# Patient Record
Sex: Male | Born: 1955 | Race: White | Hispanic: No | Marital: Married | Smoking: Never smoker
Health system: Southern US, Community
[De-identification: ages and names within clinical notes are randomized; demographics above are authoritative.]

## PROBLEM LIST (undated history)

## (undated) DIAGNOSIS — I6529 Occlusion and stenosis of unspecified carotid artery: Secondary | ICD-10-CM

## (undated) DIAGNOSIS — E11319 Type 2 diabetes mellitus with unspecified diabetic retinopathy without macular edema: Secondary | ICD-10-CM

## (undated) DIAGNOSIS — I519 Heart disease, unspecified: Secondary | ICD-10-CM

## (undated) DIAGNOSIS — I6503 Occlusion and stenosis of bilateral vertebral arteries: Secondary | ICD-10-CM

## (undated) DIAGNOSIS — Z8489 Family history of other specified conditions: Secondary | ICD-10-CM

## (undated) DIAGNOSIS — I639 Cerebral infarction, unspecified: Secondary | ICD-10-CM

## (undated) DIAGNOSIS — E119 Type 2 diabetes mellitus without complications: Secondary | ICD-10-CM

## (undated) DIAGNOSIS — N189 Chronic kidney disease, unspecified: Secondary | ICD-10-CM

## (undated) DIAGNOSIS — Z87442 Personal history of urinary calculi: Secondary | ICD-10-CM

## (undated) DIAGNOSIS — E785 Hyperlipidemia, unspecified: Secondary | ICD-10-CM

## (undated) HISTORY — DX: Chronic kidney disease, unspecified: N18.9

## (undated) HISTORY — PX: EYE SURGERY: SHX253

## (undated) HISTORY — DX: Cerebral infarction, unspecified: I63.9

## (undated) HISTORY — PX: APPENDECTOMY: SHX54

## (undated) HISTORY — DX: Heart disease, unspecified: I51.9

## (undated) HISTORY — DX: Occlusion and stenosis of unspecified carotid artery: I65.29

## (undated) HISTORY — DX: Hyperlipidemia, unspecified: E78.5

## (undated) HISTORY — DX: Type 2 diabetes mellitus without complications: E11.9

## (undated) HISTORY — DX: Occlusion and stenosis of bilateral vertebral arteries: I65.03

## (undated) HISTORY — PX: COLONOSCOPY: SHX174

## (undated) HISTORY — DX: Type 2 diabetes mellitus with unspecified diabetic retinopathy without macular edema: E11.319

---

## 2006-06-23 ENCOUNTER — Inpatient Hospital Stay (HOSPITAL_COMMUNITY): Admission: EM | Admit: 2006-06-23 | Discharge: 2006-06-30 | Payer: Self-pay | Admitting: Emergency Medicine

## 2006-06-23 ENCOUNTER — Ambulatory Visit: Payer: Self-pay | Admitting: Cardiology

## 2006-09-08 ENCOUNTER — Ambulatory Visit: Payer: Self-pay | Admitting: Internal Medicine

## 2006-09-08 ENCOUNTER — Ambulatory Visit (HOSPITAL_COMMUNITY): Admission: RE | Admit: 2006-09-08 | Discharge: 2006-09-08 | Payer: Self-pay | Admitting: Neurology

## 2016-03-28 DIAGNOSIS — N19 Unspecified kidney failure: Secondary | ICD-10-CM | POA: Diagnosis not present

## 2016-03-28 DIAGNOSIS — I639 Cerebral infarction, unspecified: Secondary | ICD-10-CM

## 2016-03-28 DIAGNOSIS — Z8673 Personal history of transient ischemic attack (TIA), and cerebral infarction without residual deficits: Secondary | ICD-10-CM

## 2016-03-28 DIAGNOSIS — I674 Hypertensive encephalopathy: Secondary | ICD-10-CM | POA: Diagnosis not present

## 2016-03-28 DIAGNOSIS — E119 Type 2 diabetes mellitus without complications: Secondary | ICD-10-CM

## 2016-03-29 DIAGNOSIS — I674 Hypertensive encephalopathy: Secondary | ICD-10-CM | POA: Diagnosis not present

## 2016-03-29 DIAGNOSIS — E119 Type 2 diabetes mellitus without complications: Secondary | ICD-10-CM | POA: Diagnosis not present

## 2016-03-29 DIAGNOSIS — Z8673 Personal history of transient ischemic attack (TIA), and cerebral infarction without residual deficits: Secondary | ICD-10-CM | POA: Diagnosis not present

## 2016-03-29 DIAGNOSIS — N19 Unspecified kidney failure: Secondary | ICD-10-CM | POA: Diagnosis not present

## 2016-04-15 DIAGNOSIS — I7090 Unspecified atherosclerosis: Secondary | ICD-10-CM | POA: Diagnosis not present

## 2016-04-15 DIAGNOSIS — Z8673 Personal history of transient ischemic attack (TIA), and cerebral infarction without residual deficits: Secondary | ICD-10-CM | POA: Diagnosis not present

## 2016-04-15 DIAGNOSIS — E538 Deficiency of other specified B group vitamins: Secondary | ICD-10-CM | POA: Diagnosis not present

## 2016-04-15 DIAGNOSIS — D751 Secondary polycythemia: Secondary | ICD-10-CM | POA: Diagnosis not present

## 2016-04-28 ENCOUNTER — Encounter: Payer: Self-pay | Admitting: Vascular Surgery

## 2016-04-30 DIAGNOSIS — E538 Deficiency of other specified B group vitamins: Secondary | ICD-10-CM | POA: Diagnosis not present

## 2016-04-30 DIAGNOSIS — D751 Secondary polycythemia: Secondary | ICD-10-CM | POA: Diagnosis not present

## 2016-06-02 ENCOUNTER — Encounter: Payer: Self-pay | Admitting: Vascular Surgery

## 2016-06-05 ENCOUNTER — Encounter (HOSPITAL_COMMUNITY): Payer: Medicare Other

## 2016-06-05 ENCOUNTER — Encounter: Payer: Medicare Other | Admitting: Vascular Surgery

## 2016-08-24 ENCOUNTER — Encounter: Payer: Self-pay | Admitting: Vascular Surgery

## 2016-09-04 ENCOUNTER — Ambulatory Visit (INDEPENDENT_AMBULATORY_CARE_PROVIDER_SITE_OTHER): Payer: Commercial Managed Care - PPO | Admitting: Vascular Surgery

## 2016-09-04 ENCOUNTER — Encounter: Payer: Self-pay | Admitting: Vascular Surgery

## 2016-09-04 VITALS — BP 107/80 | HR 50 | Temp 97.3°F | Resp 16 | Ht <= 58 in | Wt 193.0 lb

## 2016-09-04 DIAGNOSIS — I63019 Cerebral infarction due to thrombosis of unspecified vertebral artery: Secondary | ICD-10-CM | POA: Insufficient documentation

## 2016-09-04 DIAGNOSIS — I779 Disorder of arteries and arterioles, unspecified: Secondary | ICD-10-CM | POA: Diagnosis not present

## 2016-09-04 DIAGNOSIS — I739 Peripheral vascular disease, unspecified: Secondary | ICD-10-CM

## 2016-09-04 NOTE — Progress Notes (Signed)
Referred by:  Everlean Cherry, MD [address] [place], [place] [address]   Reason for referral: possible vertebral disease    History of Present Illness   Michael Hoffman is a 61 y.o. (1955-08-30) male who presents with cc: stroke.  This patient has limited recollection of his stroke in November.  Per his wife, he started having confusion similar to prior stroke and went to ED at [facility].  Her was diagnosed with a cerebral infarct at that location.  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  The patient is not certain on the sx of his recent CVA or his prior CVA.  His risk factors for carotid disease are: DM, HLD, and HTN.  Past Medical History 1. B carotid stenosis 2. CKD Stage 2 3. IDDM 4. Diabetic retinopathy 5. HLD 6. 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 CVA x 2  Past Surgical History: none reported   Social History   Social History  . Marital status: Married    Spouse name: N/A  . Number of children: N/A  . Years of education: N/A   Occupational History  . Not on file.   Social History Main Topics  . Smoking status: Not on file  . Smokeless tobacco: Not on file  . Alcohol use Not on file  . Drug use: Unknown  . Sexual activity: Not on file   Other Topics Concern  . Not on file   Social History Narrative  . No narrative on file    Family History: patient is unable to detail the medical history of his parents   Current Outpatient Prescriptions  Medication Sig Dispense Refill  . amLODipine (NORVASC) 10 MG tablet Take 10 mg by mouth daily.    . carvedilol (COREG) 25 MG tablet Take 25 mg by mouth 2 (two) times daily with a meal.    . citalopram (CELEXA) 10 MG tablet Take 10 mg by mouth daily.    . cloNIDine (CATAPRES) 0.1 MG tablet Take 0.1 mg by mouth 2 (two) times daily.    . clopidogrel (PLAVIX) 75 MG tablet  Take 75 mg by mouth daily.    Marland Kitchen donepezil (ARICEPT) 10 MG tablet Take 10 mg by mouth at bedtime.    . insulin glargine (LANTUS) 100 UNIT/ML injection Inject 10 Units into the skin at bedtime.    Marland Kitchen lisinopril (PRINIVIL,ZESTRIL) 40 MG tablet Take 40 mg by mouth daily.    . metFORMIN (GLUCOPHAGE) 500 MG tablet Take by mouth 2 (two) times daily with a meal.    . simvastatin (ZOCOR) 20 MG tablet Take 20 mg by mouth daily.     No current facility-administered medications for this visit.     No Known Allergies   REVIEW OF SYSTEMS:   Cardiac:  positive for: no symptoms, negative for: Chest pain or chest pressure, Shortness of breath upon exertion and Shortness of breath when lying flat,   Vascular:  positive for: Leg swelling,  negative for: Pain in calf, thigh, or hip brought on by ambulation, Pain in feet at night that wakes you up from your sleep and Blood clot in your veins  Pulmonary:  positive for: no symptoms,  negative for: Oxygen at home, Productive cough and Wheezing  Neurologic:  positive for: No symptoms, negative for: Sudden weakness in arms or legs, Sudden numbness in arms  or legs, Sudden onset of difficulty speaking or slurred speech, Temporary loss of vision in one eye and Problems with dizziness  Gastrointestinal:  positive for: no symptoms, negative for: Blood in stool and Vomited blood  Genitourinary:  positive for: no symptoms, negative for: Burning when urinating and Blood in urine  Psychiatric:  positive for: no symptoms,  negative for: Major depression  Hematologic:  positive for: no symptoms,  negative for: negative for: Bleeding problems and Problems with blood clotting too easily  Dermatologic:  positive for: abnormal skin lesions, negative for: Rashes or ulcers and abnormal skin lesions  Constitutional:  positive for: no symptoms, negative for: Fever or chills  Ear/Nose/Throat:  positive for: no symptoms, negative for: Change in hearing,  Nose bleeds and Sore throat  Musculoskeletal:  positive for: no symptoms, negative for: Back pain, Joint pain and Muscle pain   For VQI Use Only   PRE-ADM LIVING Home  AMB STATUS Ambulatory  CAD Sx None  PRIOR CHF None  STRESS TEST No    Physical Examination   Vitals:   09/04/16 1449 09/04/16 1450  BP: (!) 151/69 107/80  Pulse: (!) 50   Resp: 16   Temp: 97.3 F (36.3 C)   TempSrc: Oral   SpO2: 97%   Weight: 193 lb (87.5 kg)   Height: 1' (0.305 m)     General Alert, O x 3, WD, NAD  Head [place]/AT,    Ear/Nose/Throat Hearing grossly intact, nares without erythema or drainage, oropharynx without Erythema or Exudate, Mallampati score: 3, Dentition intact  Eyes PERRLA, EOMI,    Neck Supple, mid-line trachea,    Pulmonary Sym exp, good B air movt, CTA B  Cardiac RRR, Nl S1, S2, Murmur present: throughout all listening areas, No rubs, No S3,S4  Vascular Vessel Right Left  Radial Palpable Palpable  Brachial Palpable Palpable  Carotid Palpable, No Bruit Palpable, No Bruit  Aorta Not palpable N/A  Femoral Palpable Palpable  Popliteal Not palpable Not palpable  PT Palpable Palpable  DP Palpable Palpable    Gastrointestinal soft, non-distended, non-tender to palpation, No guarding or rebound, no HSM, no masses, no CVAT B, No palpable prominent aortic pulse,    Musculoskeletal M/S 5/5 throughout  , Extremities without ischemic changes  , No edema present, No obvious varicosities , No Lipodermatosclerosis present  Neurologic Cranial nerves 2-12 intact , Pain and light touch intact in extremities , Motor exam as listed above, no obvious discoordination  Psychiatric Judgement intact, Mood & affect appropriate for pt's clinical situation  Dermatologic See M/S exam for extremity exam, No rashes otherwise noted  Lymphatic  Palpable lymph nodes: None    Radiology   MRI Head (03/28/16):  5 mm acute infarct left inferior cerebellum  Atrophy and extensive chronic microvascular  ischemic changes have progressed since 2011  CTA Head and neck (03/29/16):  Atherosclerotic disease in the carotid arteries in the neck bilaterally without significant stenosis.  Occlusion of the proximal right vertebral artery which reconstitutes at C5 of a small vessel which is patent to the basilar.  Severe stenosis origin of the left vertebral artery which is subsequently widely patent to the basilar.  Dominant left vertebral artery.  Suboptimal enhancement of the intracranial arteries due to timing of the scan with extensive venous contrast. There is extensive atherosclerotic disease in the cavernous carotid bilaterally.  No intracranial large vessel occlusion.     Outside Studies/Documentation   10 pages of outside documents were reviewed including: outside radiologic study and  ED reports.   Medical Decision Making   Michael Hoffman is a 61 y.o. male who presents with: cerebellar CVA, R VA occlusion, possible L VA stenosis, IDDM with complications   Based on the patient's vascular studies and examination, I have offered the patient: referral to [facility] (Dr. Corliss Skains) for B carotid, cerebral, and vertebral angiography with possible L vertebral intervention.  Dr. Corliss Skains has previously completing imaging studies for this patient.  No one at this vascular surgical practice has done a vertebral transposition in the last decade, so if surgical intervention is needed, will refer to [facility]  It also make sense to refer this pt to [facility] as Vascular does not routine do endovascular intervention on the vertebral artery also.  I discussed in depth with the patient the nature of atherosclerosis, and emphasized the importance of maximal medical management including strict control of blood pressure, blood glucose, and lipid levels, obtaining regular exercise, antiplatelet agents, and cessation of smoking.   The patient is currently on a statin: Zocor.  The patient is currently on an  anti-platelet: Plavix.  The patient is aware that without maximal medical management the underlying atherosclerotic disease process will progress, limiting the benefit of any interventions.  Thank you for allowing [place] to participate in this patient's care.   Leonides Sake, MD, FACS [facility] Office: [phone] Pager: [phone]  09/04/2016, 3:22 PM

## 2016-09-07 ENCOUNTER — Encounter: Payer: Self-pay | Admitting: Internal Medicine

## 2016-09-21 ENCOUNTER — Other Ambulatory Visit (HOSPITAL_COMMUNITY): Payer: Self-pay | Admitting: Interventional Radiology

## 2016-09-21 DIAGNOSIS — I639 Cerebral infarction, unspecified: Secondary | ICD-10-CM

## 2016-09-21 DIAGNOSIS — I771 Stricture of artery: Secondary | ICD-10-CM

## 2016-10-01 ENCOUNTER — Other Ambulatory Visit: Payer: Self-pay | Admitting: Radiology

## 2016-10-01 ENCOUNTER — Other Ambulatory Visit: Payer: Self-pay | Admitting: Physician Assistant

## 2016-10-02 ENCOUNTER — Ambulatory Visit (HOSPITAL_COMMUNITY)
Admission: RE | Admit: 2016-10-02 | Discharge: 2016-10-02 | Disposition: A | Discharge status: Home / Self Care | Payer: Commercial Managed Care - PPO | Source: Ambulatory Visit | Attending: Interventional Radiology | Admitting: Interventional Radiology

## 2016-10-02 ENCOUNTER — Encounter (HOSPITAL_COMMUNITY): Payer: Self-pay

## 2016-10-02 ENCOUNTER — Other Ambulatory Visit (HOSPITAL_COMMUNITY): Payer: Self-pay | Admitting: Interventional Radiology

## 2016-10-02 DIAGNOSIS — I771 Stricture of artery: Secondary | ICD-10-CM

## 2016-10-02 DIAGNOSIS — G450 Vertebro-basilar artery syndrome: Secondary | ICD-10-CM | POA: Diagnosis not present

## 2016-10-02 DIAGNOSIS — E11319 Type 2 diabetes mellitus with unspecified diabetic retinopathy without macular edema: Secondary | ICD-10-CM | POA: Insufficient documentation

## 2016-10-02 DIAGNOSIS — E1122 Type 2 diabetes mellitus with diabetic chronic kidney disease: Secondary | ICD-10-CM | POA: Insufficient documentation

## 2016-10-02 DIAGNOSIS — I708 Atherosclerosis of other arteries: Secondary | ICD-10-CM | POA: Insufficient documentation

## 2016-10-02 DIAGNOSIS — Z7902 Long term (current) use of antithrombotics/antiplatelets: Secondary | ICD-10-CM | POA: Diagnosis not present

## 2016-10-02 DIAGNOSIS — E785 Hyperlipidemia, unspecified: Secondary | ICD-10-CM | POA: Insufficient documentation

## 2016-10-02 DIAGNOSIS — N189 Chronic kidney disease, unspecified: Secondary | ICD-10-CM | POA: Insufficient documentation

## 2016-10-02 DIAGNOSIS — Z8673 Personal history of transient ischemic attack (TIA), and cerebral infarction without residual deficits: Secondary | ICD-10-CM | POA: Insufficient documentation

## 2016-10-02 DIAGNOSIS — I639 Cerebral infarction, unspecified: Secondary | ICD-10-CM

## 2016-10-02 DIAGNOSIS — I6521 Occlusion and stenosis of right carotid artery: Secondary | ICD-10-CM | POA: Diagnosis present

## 2016-10-02 DIAGNOSIS — Z794 Long term (current) use of insulin: Secondary | ICD-10-CM | POA: Insufficient documentation

## 2016-10-02 HISTORY — PX: IR ANGIO INTRA EXTRACRAN SEL COM CAROTID INNOMINATE BILAT MOD SED: IMG5360

## 2016-10-02 HISTORY — PX: IR ANGIO VERTEBRAL SEL SUBCLAVIAN INNOMINATE BILAT MOD SED: IMG5366

## 2016-10-02 LAB — BASIC METABOLIC PANEL
Anion gap: 8 (ref 5–15)
BUN: 21 mg/dL — AB (ref 6–20)
CHLORIDE: 105 mmol/L (ref 101–111)
CO2: 28 mmol/L (ref 22–32)
CREATININE: 1.48 mg/dL — AB (ref 0.61–1.24)
Calcium: 9.4 mg/dL (ref 8.9–10.3)
GFR calc Af Amer: 58 mL/min — ABNORMAL LOW (ref >60)
GFR calc non Af Amer: 50 mL/min — ABNORMAL LOW (ref >60)
Glucose, Bld: 100 mg/dL — ABNORMAL HIGH (ref 65–99)
Potassium: 3.5 mmol/L (ref 3.5–5.1)
Sodium: 141 mmol/L (ref 135–145)

## 2016-10-02 LAB — CBC
HEMATOCRIT: 45.9 % (ref 39.0–52.0)
Hemoglobin: 16 g/dL (ref 13.0–17.0)
MCH: 31.1 pg (ref 26.0–34.0)
MCHC: 34.9 g/dL (ref 30.0–36.0)
MCV: 89.1 fL (ref 78.0–100.0)
PLATELETS: 261 10*3/uL (ref 150–400)
RBC: 5.15 MIL/uL (ref 4.22–5.81)
RDW: 12.5 % (ref 11.5–15.5)
WBC: 10.2 10*3/uL (ref 4.0–10.5)

## 2016-10-02 LAB — PROTIME-INR
INR: 0.93
Prothrombin Time: 12.5 seconds (ref 11.4–15.2)

## 2016-10-02 LAB — APTT: aPTT: 31 seconds (ref 24–36)

## 2016-10-02 MED ORDER — SODIUM CHLORIDE 0.9 % IV SOLN
INTRAVENOUS | Status: DC
  Administered 2016-10-02: 07:00:00 via INTRAVENOUS

## 2016-10-02 MED ORDER — HEPARIN SODIUM (PORCINE) 1000 UNIT/ML IJ SOLN
INTRAMUSCULAR | Status: AC | PRN
  Administered 2016-10-02: 1000 [IU] via INTRAVENOUS

## 2016-10-02 MED ORDER — IOPAMIDOL (ISOVUE-300) INJECTION 61%
INTRAVENOUS | Status: AC
  Administered 2016-10-02: 75 mL
  Filled 2016-10-02: qty 150

## 2016-10-02 MED ORDER — LIDOCAINE HCL 1 % IJ SOLN
INTRAMUSCULAR | Status: AC | PRN
  Administered 2016-10-02: 10 mL

## 2016-10-02 MED ORDER — SODIUM CHLORIDE 0.9 % IV SOLN
INTRAVENOUS | Status: AC | PRN
  Administered 2016-10-02: 250 mL via INTRAVENOUS

## 2016-10-02 MED ORDER — MIDAZOLAM HCL 2 MG/2ML IJ SOLN
INTRAMUSCULAR | Status: AC
  Filled 2016-10-02: qty 2

## 2016-10-02 MED ORDER — HEPARIN SODIUM (PORCINE) 1000 UNIT/ML IJ SOLN
INTRAMUSCULAR | Status: AC
  Filled 2016-10-02: qty 2

## 2016-10-02 MED ORDER — SODIUM CHLORIDE 0.9 % IV SOLN
INTRAVENOUS | Status: AC
  Administered 2016-10-02: 10:00:00 via INTRAVENOUS

## 2016-10-02 MED ORDER — LIDOCAINE HCL 1 % IJ SOLN
INTRAMUSCULAR | Status: AC
  Filled 2016-10-02: qty 20

## 2016-10-02 MED ORDER — FENTANYL CITRATE (PF) 100 MCG/2ML IJ SOLN
INTRAMUSCULAR | Status: AC
  Filled 2016-10-02: qty 2

## 2016-10-02 MED ORDER — IOPAMIDOL (ISOVUE-300) INJECTION 61%
INTRAVENOUS | Status: AC
  Administered 2016-10-02: 25 mL
  Filled 2016-10-02: qty 50

## 2016-10-02 NOTE — H&P (Signed)
Chief Complaint: Patient was seen in consultation today for cerebral arteriogram at the request of Dr Early Osmond  Referring Physician(s): Dr Early Osmond  Supervising Physician: Julieanne Cotton  Patient Status: [facility] - Out-pt  History of Present Illness: Michael Hoffman is a 61 y.o. male   Hx CVA 03/2016 Confusion and balance abnormality Symptoms remain of slow speech and minimal confusion Work up revealed CVA  MRI and CTA 03/2016:  MRI Head (03/28/16):  5 mm acute infarct left inferior cerebellum  Atrophy and extensive chronic microvascular ischemic changes have progressed since 2011  CTA Head and neck (03/29/16):  Atherosclerotic disease in the carotid arteries in the neck bilaterally without significant stenosis.  Occlusion of the proximal right vertebral artery which reconstitutes at C5 of a small vessel which is patent to the basilar.  Severe stenosis origin of the left vertebral artery which is subsequently widely patent to the basilar.  Dominant left vertebral artery. Suboptimal enhancement of the intracranial arteries due to timing of the scan with extensive venous contrast. There is extensive atherosclerotic disease in the cavernous carotid bilaterally.  No intracranial large vessel occlusion.    Request for cerebral arteriogram per Dr Imogene Burn Now scheduled for same  Past Medical History:  Diagnosis Date  . Carotid artery occlusion   . Chronic kidney disease   . Diabetes mellitus without complication (HCC)   . Diabetic retinopathy (HCC)   . Dyslipidemia   . Heart disease   . Stenosis of both vertebral arteries   . Stroke [facility])     History reviewed. No pertinent surgical history.  Allergies: Patient has no known allergies.  Medications: Prior to Admission medications   Medication Sig Start Date End Date Taking? Authorizing Provider  amLODipine (NORVASC) 10 MG tablet Take 10 mg by mouth daily.   Yes [provider]  carvedilol (COREG) 25 MG tablet  Take 25 mg by mouth 2 (two) times daily with a meal.   Yes [provider]  citalopram (CELEXA) 10 MG tablet Take 10 mg by mouth daily.   Yes [provider]  cloNIDine (CATAPRES) 0.1 MG tablet Take 0.1 mg by mouth 2 (two) times daily.   Yes [provider]  clopidogrel (PLAVIX) 75 MG tablet Take 75 mg by mouth daily.   Yes [provider]  donepezil (ARICEPT) 10 MG tablet Take 10 mg by mouth at bedtime.   Yes [provider]  insulin glargine (LANTUS) 100 UNIT/ML injection Inject 10 Units into the skin at bedtime.   Yes [provider]  lisinopril (PRINIVIL,ZESTRIL) 40 MG tablet Take 40 mg by mouth daily.   Yes [provider]  metFORMIN (GLUCOPHAGE) 1000 MG tablet Take 500 mg by mouth 2 (two) times daily with a meal.    Yes [provider]  simvastatin (ZOCOR) 20 MG tablet Take 20 mg by mouth daily.   Yes [provider]     History reviewed. No pertinent family history.  Social History   Social History  . Marital status: Married    Spouse name: N/A  . Number of children: N/A  . Years of education: N/A   Social History Main Topics  . Smoking status: None  . Smokeless tobacco: None  . Alcohol use None  . Drug use: Unknown  . Sexual activity: Not Asked   Other Topics Concern  . None   Social History Narrative  . None    Review of Systems: A 12 point ROS discussed and pertinent  positives are indicated in the HPI above.  All other systems are negative.  Review of Systems  Constitutional: Negative for appetite change, fatigue and fever.  HENT: Negative for tinnitus and trouble swallowing.   Eyes: Negative for visual disturbance.  Respiratory: Negative for cough and shortness of breath.   Cardiovascular: Negative for chest pain.  Gastrointestinal: Negative for abdominal pain.  Musculoskeletal: Negative for gait problem.  Neurological: Positive for speech difficulty. Negative for dizziness,  tremors, seizures, syncope, facial asymmetry, weakness, light-headedness, numbness and headaches.  Psychiatric/Behavioral: Positive for decreased concentration. Negative for behavioral problems.    Vital Signs: BP 123/60   Pulse (!) 43   Temp 97.6 F (36.4 C) (Oral)   Resp 16   Ht 5\' 7"  (1.702 m)   Wt 190 lb (86.2 kg)   SpO2 98%   BMI 29.76 kg/m   Physical Exam  Constitutional: He is oriented to person, place, and time. He appears well-nourished.  HENT:  Head: Atraumatic.  Eyes: EOM are normal.  Neck: Neck supple.  Cardiovascular: Normal rate and regular rhythm.   Murmur heard. Pulmonary/Chest: Effort normal and breath sounds normal. He has no wheezes.  Abdominal: Soft. Bowel sounds are normal. There is no tenderness.  Musculoskeletal: Normal range of motion.  Neurological: He is alert and oriented to person, place, and time.  Skin: Skin is warm and dry.  Psychiatric: He has a normal mood and affect. His behavior is normal. Judgment and thought content normal.  Speech is slow- but accurate  Nursing note and vitals reviewed.   Mallampati Score:  MD Evaluation Airway: WNL Heart: WNL Abdomen: WNL Chest/ Lungs: WNL ASA  Classification: 2 Mallampati/Airway Score: Two  Imaging: No results found.  Labs:  CBC:  Recent Labs  10/02/16 0707  WBC 10.2  HGB 16.0  HCT 45.9  PLT 261    COAGS:  Recent Labs  10/02/16 0707  INR 0.93  APTT 31    BMP: No results for input(s): NA, K, CL, CO2, GLUCOSE, BUN, CALCIUM, CREATININE, GFRNONAA, GFRAA in the last 8760 hours.  Invalid input(s): CMP  LIVER FUNCTION TESTS: No results for input(s): BILITOT, AST, ALT, ALKPHOS, PROT, ALBUMIN in the last 8760 hours.  TUMOR MARKERS: No results for input(s): AFPTM, CEA, CA199, CHROMGRNA in the last 8760 hours.  Assessment and Plan:  CVA 03/2016 Evaluation was scheduled with Dr Imogene Burnhen in 05/2016 but rescheduled to 08/2016 Bilat vertebral artery stenosis Now scheduled for  cerebral arteriogram  Risks and Benefits discussed with the patient including, but not limited to bleeding, infection, vascular injury, contrast induced renal failure, stroke or even death. All of the patient's questions were answered, patient is agreeable to proceed. Consent signed and in chart.  Thank you for this interesting consult.  I greatly enjoyed meeting Rual R Pflug and look forward to participating in their care.  A copy of this report was sent to the requesting provider on this date.  Electronically Signed: Robet LeuURPIN,Jawana Reagor A, PA-C 10/02/2016, 7:35 AM   I spent a total of  30 Minutes   in face to face in clinical consultation, greater than 50% of which was counseling/coordinating care for cerebral arteriogram

## 2016-10-02 NOTE — Sedation Documentation (Signed)
5 Fr. Exoseal to right groin 

## 2016-10-02 NOTE — Progress Notes (Signed)
Lying blood pressure 147/58 Standing blood pressure 147/59 Right groin unremarkable, clean dry and intact dressing Pt up to bathroom without lightheadedness. Groin remains unremarkable upon return to room.

## 2016-10-02 NOTE — Discharge Instructions (Addendum)
Angiogram, Care After °This sheet gives you information about how to care for yourself after your procedure. Your health care provider may also give you more specific instructions. If you have problems or questions, contact your health care provider. °What can I expect after the procedure? °After the procedure, it is common to have bruising and tenderness at the catheter insertion area. °Follow these instructions at home: °Insertion site care  °· Follow instructions from your health care provider about how to take care of your insertion site. Make sure you: °¨ Wash your hands with soap and water before you change your bandage (dressing). If soap and water are not available, use hand sanitizer. °¨ Change your dressing as told by your health care provider. °¨ Leave stitches (sutures), skin glue, or adhesive strips in place. These skin closures may need to stay in place for 2 weeks or longer. If adhesive strip edges start to loosen and curl up, you may trim the loose edges. Do not remove adhesive strips completely unless your health care provider tells you to do that. °· Do not take baths, swim, or use a hot tub until your health care provider approves. °· You may shower 24-48 hours after the procedure or as told by your health care provider. °¨ Gently wash the site with plain soap and water. °¨ Pat the area dry with a clean towel. °¨ Do not rub the site. This may cause bleeding. °· Do not apply powder or lotion to the site. Keep the site clean and dry. °· Check your insertion site every day for signs of infection. Check for: °¨ Redness, swelling, or pain. °¨ Fluid or blood. °¨ Warmth. °¨ Pus or a bad smell. °Activity  °· Rest as told by your health care provider, usually for 1-2 days. °· Do not lift anything that is heavier than 10 lbs. (4.5 kg) or as told by your health care provider. °· Do not drive for 24 hours if you were given a medicine to help you relax (sedative). °· Do not drive or use heavy machinery while  taking prescription pain medicine. °General instructions  °· Return to your normal activities as told by your health care provider, usually in about a week. Ask your health care provider what activities are safe for you. °· If the catheter site starts bleeding, lie flat and put pressure on the site. If the bleeding does not stop, get help right away. This is a medical emergency. °· Drink enough fluid to keep your urine clear or pale yellow. This helps flush the contrast dye from your body. °· Take over-the-counter and prescription medicines only as told by your health care provider. °· Keep all follow-up visits as told by your health care provider. This is important. °Contact a health care provider if: °· You have a fever or chills. °· You have redness, swelling, or pain around your insertion site. °· You have fluid or blood coming from your insertion site. °· The insertion site feels warm to the touch. °· You have pus or a bad smell coming from your insertion site. °· You have bruising around the insertion site. °· You notice blood collecting in the tissue around the catheter site (hematoma). The hematoma may be painful to the touch. °Get help right away if: °· You have severe pain at the catheter insertion area. °· The catheter insertion area swells very fast. °· The catheter insertion area is bleeding, and the bleeding does not stop when you hold steady pressure on   the area.  The area near or just beyond the catheter insertion site becomes pale, cool, tingly, or numb. These symptoms may represent a serious problem that is an emergency. Do not wait to see if the symptoms will go away. Get medical help right away. Call your local emergency services (911 in the [place] Do not drive yourself to the hospital. Summary  After the procedure, it is common to have bruising and tenderness at the catheter insertion area.  After the procedure, it is important to rest and drink plenty of fluids.  Do not take baths,  swim, or use a hot tub until your health care provider says it is okay to do so. You may shower 24-48 hours after the procedure or as told by your health care provider.  If the catheter site starts bleeding, lie flat and put pressure on the site. If the bleeding does not stop, get help right away. This is a medical emergency. This information is not intended to replace advice given to you by your health care provider. Make sure you discuss any questions you have with your health care provider. Document Released: 11/20/2004 Document Revised: 04/08/2016 Document Reviewed: 04/08/2016 Elsevier Interactive Patient Education  2017 [employer].    Outpatient Metformin Instructions (Glucophage, Glucovance, Fortamet, Riomet, [place]green, [place], Actoplus met  Avandamet, Janumet)   Patient: Michael Hoffman                                                10/02/2016:    Radiology Exam:     As part of your exam today in the Radiology Department, you were given a radiographic contrast material or x-ray dye.  Because you have had this contrast material and you are taking a Metformin drug (Glucophage, Glucovance, Avandamet, Fortamet, Riomet, Metaglip, Glumetza, Actoplus met, Actoplus Met XR, Prandimet or Janumet), please observe the following instructions:   DO NOT  Take this medication for 48 hours after your exam.  Because you have normal renal function and have no comorbidities, you may restart your medication in 48 hours with no need for a renal function test or consultation with your physician.  You have normal renal function but have some comorbidities.  Comorbidities include liver disease, alcohol overuse, heart failure, myocardial or muscular ischemia, sepsis, or other severe infection.  Therefore you should consult your physician before restarting your medication.  You have impaired renal function.  You should consult your physician before restarting your medication and you are advised to get a  renal function test before restarting your medication.  Please discuss this with your physician.   Call your doctor before you start taking this medication again.  Your doctor may want to check your kidney function before you start taking this medication again.  I understand these instructions and have had an opportunity to discuss them with Radiology Department personnel.      Cerebral Angiogram, Care After Refer to this sheet in the next few weeks. These instructions provide you with information on caring for yourself after your procedure. Your health care provider may also give you more specific instructions. Your treatment has been planned according to current medical practices, but problems sometimes occur. Call your health care provider if you have any problems or questions after your procedure. What can I expect after the procedure? After your procedure, it is typical to have  the following:  Bruising at the catheter insertion site that usually fades within 1-2 weeks.  Blood collecting in the tissue (hematoma) that may be painful to the touch. It should usually decrease in size and tenderness within 1-2 weeks.  A mild headache. Follow these instructions at home:  Take medicines only as directed by your health care provider.  You may shower 24-48 hours after the procedure or as directed by your health care provider. Remove the bandage (dressing) and gently wash the site with plain soap and water. Pat the area dry with a clean towel. Do not rub the site, because this may cause bleeding.  Do not take baths, swim, or use a hot tub until your health care provider approves.  Check your insertion site every day for redness, swelling, or drainage.  Do not apply powder or lotion to the site.  Do not lift over 10 lb (4.5 kg) for 5 days after your procedure or as directed by your health care provider.  Ask your health care provider when it is okay to:  Return to work or  school.  Resume usual physical activities or sports.  Resume sexual activity.  Do not drive home if you are discharged the same day as the procedure. Have someone else drive you.  You may drive 24 hours after the procedure unless otherwise instructed by your health care provider.  Do not operate machinery or power tools for 24 hours after the procedure or as directed by your health care provider.  If your procedure was done as an outpatient procedure, which means that you went home the same day as your procedure, a responsible adult should be with you for the first 24 hours after you arrive home.  Keep all follow-up visits as directed by your health care provider. This is important. Contact a health care provider if:  You have a fever.  You have chills.  You have increased bleeding from the catheter insertion site. Hold pressure on the site. Get help right away if:  You have vision changes or loss of vision.  You have numbness or weakness on one side of your body.  You have difficulty talking, or you have slurred speech or cannot speak (aphasia).  You feel confused or have difficulty remembering.  You have unusual pain at the catheter insertion site.  You have redness, warmth, or swelling at the catheter insertion site.  You have drainage (other than a small amount of blood on the dressing) from the catheter insertion site.  The catheter insertion site is bleeding, and the bleeding does not stop after 30 minutes of holding steady pressure on the site. These symptoms may represent a serious problem that is an emergency. Do not wait to see if the symptoms will go away. Get medical help right away. Call your local emergency services (911 in [place] Do not drive yourself to the hospital. This information is not intended to replace advice given to you by your health care provider. Make sure you discuss any questions you have with your health care provider. Document Released:  09/18/2013 Document Revised: 10/10/2015 Document Reviewed: 05/17/2013 Elsevier Interactive Patient Education  2017 [place]. Moderate Conscious Sedation, Adult, Care After These instructions provide you with information about caring for yourself after your procedure. Your health care provider may also give you more specific instructions. Your treatment has been planned according to current medical practices, but problems sometimes occur. Call your health care provider if you have any problems or questions after  your procedure. What can I expect after the procedure? After your procedure, it is common:  To feel sleepy for several hours.  To feel clumsy and have poor balance for several hours.  To have poor judgment for several hours.  To vomit if you eat too soon. Follow these instructions at home: For at least 24 hours after the procedure:    Do not:  Participate in activities where you could fall or become injured.  Drive.  Use heavy machinery.  Drink alcohol.  Take sleeping pills or medicines that cause drowsiness.  Make important decisions or sign legal documents.  Take care of children on your own.  Rest. Eating and drinking   Follow the diet recommended by your health care provider.  If you vomit:  Drink water, juice, or soup when you can drink without vomiting.  Make sure you have little or no nausea before eating solid foods. General instructions   Have a responsible adult stay with you until you are awake and alert.  Take over-the-counter and prescription medicines only as told by your health care provider.  If you smoke, do not smoke without supervision.  Keep all follow-up visits as told by your health care provider. This is important. Contact a health care provider if:  You keep feeling nauseous or you keep vomiting.  You feel light-headed.  You develop a rash.  You have a fever. Get help right away if:  You have trouble breathing. This  information is not intended to replace advice given to you by your health care provider. Make sure you discuss any questions you have with your health care provider. Document Released: 02/22/2013 Document Revised: 10/07/2015 Document Reviewed: 08/24/2015 Elsevier Interactive Patient Education  2017 [employer].

## 2016-10-02 NOTE — Procedures (Signed)
S/P 4 vessel cerebral arteriogram RT CFA approach. Findings. 1. 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

## 2016-10-06 ENCOUNTER — Encounter (HOSPITAL_COMMUNITY): Payer: Self-pay | Admitting: Interventional Radiology

## 2016-10-19 ENCOUNTER — Telehealth (HOSPITAL_COMMUNITY): Payer: Self-pay | Admitting: Radiology

## 2016-10-19 ENCOUNTER — Other Ambulatory Visit (HOSPITAL_COMMUNITY): Payer: Self-pay | Admitting: Interventional Radiology

## 2016-10-19 DIAGNOSIS — I771 Stricture of artery: Secondary | ICD-10-CM

## 2016-10-19 NOTE — Telephone Encounter (Signed)
Called pt, left VM for them to call to set up left vertebral artery stenting procedure. JM

## 2016-10-23 ENCOUNTER — Encounter (HOSPITAL_COMMUNITY): Payer: Self-pay | Admitting: *Deleted

## 2016-10-23 ENCOUNTER — Other Ambulatory Visit: Payer: Self-pay | Admitting: General Surgery

## 2016-10-23 ENCOUNTER — Other Ambulatory Visit: Payer: Self-pay | Admitting: Physician Assistant

## 2016-10-25 ENCOUNTER — Ambulatory Visit (HOSPITAL_COMMUNITY): Payer: Commercial Managed Care - PPO | Admitting: Anesthesiology

## 2016-10-25 NOTE — Anesthesia Preprocedure Evaluation (Deleted)
Anesthesia Evaluation  Patient identified by MRN, date of birth, ID band Patient awake    Reviewed: Allergy & Precautions, H&P , NPO status , Patient's Chart, lab work & pertinent test results, reviewed documented beta blocker date and time   Airway Mallampati: II  TM Distance: >3 FB Neck ROM: Full    Dental no notable dental hx. (+) Teeth Intact, Dental Advisory Given   Pulmonary neg pulmonary ROS,    Pulmonary exam normal breath sounds clear to auscultation       Cardiovascular Exercise Tolerance: Good hypertension, On Medications and On Home Beta Blockers + Peripheral Vascular Disease   Rhythm:Regular Rate:Normal + Systolic murmurs    Neuro/Psych CVA, No Residual Symptoms negative neurological ROS  negative psych ROS   GI/Hepatic negative GI ROS, Neg liver ROS,   Endo/Other  diabetes, Insulin Dependent, Oral Hypoglycemic Agents  Renal/GU Renal InsufficiencyRenal disease  negative genitourinary   Musculoskeletal   Abdominal   Peds  Hematology negative hematology ROS (+)   Anesthesia Other Findings   Reproductive/Obstetrics negative OB ROS                            Anesthesia Physical Anesthesia Plan  ASA: III  Anesthesia Plan: General   Post-op Pain Management:    Induction: Intravenous  PONV Risk Score and Plan: 3 and Ondansetron, Dexamethasone, Propofol and Midazolam  Airway Management Planned: Oral ETT  Additional Equipment: Arterial line  Intra-op Plan:   Post-operative Plan: Extubation in OR and Possible Post-op intubation/ventilation  Informed Consent: I have reviewed the patients History and Physical, chart, labs and discussed the procedure including the risks, benefits and alternatives for the proposed anesthesia with the patient or authorized representative who has indicated his/her understanding and acceptance.   Dental advisory given  Plan Discussed with:  CRNA  Anesthesia Plan Comments:        Anesthesia Quick Evaluation

## 2016-10-26 ENCOUNTER — Encounter (HOSPITAL_COMMUNITY): Payer: Self-pay | Admitting: *Deleted

## 2016-10-26 ENCOUNTER — Encounter (HOSPITAL_COMMUNITY): Payer: Self-pay

## 2016-10-26 ENCOUNTER — Ambulatory Visit (HOSPITAL_COMMUNITY)
Admission: RE | Admit: 2016-10-26 | Discharge: 2016-10-26 | Disposition: A | Discharge status: Home / Self Care | Payer: Commercial Managed Care - PPO | Source: Ambulatory Visit | Attending: Interventional Radiology | Admitting: Interventional Radiology

## 2016-10-26 ENCOUNTER — Encounter (HOSPITAL_COMMUNITY)
Admission: RE | Disposition: A | Discharge status: Home / Self Care | Payer: Self-pay | Source: Ambulatory Visit | Attending: Interventional Radiology

## 2016-10-26 ENCOUNTER — Ambulatory Visit (HOSPITAL_COMMUNITY): Admission: RE | Admit: 2016-10-26 | Payer: Commercial Managed Care - PPO | Source: Ambulatory Visit

## 2016-10-26 DIAGNOSIS — Z7982 Long term (current) use of aspirin: Secondary | ICD-10-CM | POA: Insufficient documentation

## 2016-10-26 DIAGNOSIS — E1122 Type 2 diabetes mellitus with diabetic chronic kidney disease: Secondary | ICD-10-CM | POA: Diagnosis not present

## 2016-10-26 DIAGNOSIS — N189 Chronic kidney disease, unspecified: Secondary | ICD-10-CM | POA: Insufficient documentation

## 2016-10-26 DIAGNOSIS — I6521 Occlusion and stenosis of right carotid artery: Secondary | ICD-10-CM | POA: Insufficient documentation

## 2016-10-26 DIAGNOSIS — E11319 Type 2 diabetes mellitus with unspecified diabetic retinopathy without macular edema: Secondary | ICD-10-CM | POA: Diagnosis not present

## 2016-10-26 DIAGNOSIS — Z8673 Personal history of transient ischemic attack (TIA), and cerebral infarction without residual deficits: Secondary | ICD-10-CM | POA: Insufficient documentation

## 2016-10-26 DIAGNOSIS — Z7902 Long term (current) use of antithrombotics/antiplatelets: Secondary | ICD-10-CM | POA: Diagnosis not present

## 2016-10-26 DIAGNOSIS — I6503 Occlusion and stenosis of bilateral vertebral arteries: Secondary | ICD-10-CM | POA: Diagnosis not present

## 2016-10-26 DIAGNOSIS — Z538 Procedure and treatment not carried out for other reasons: Secondary | ICD-10-CM | POA: Diagnosis not present

## 2016-10-26 DIAGNOSIS — I708 Atherosclerosis of other arteries: Secondary | ICD-10-CM | POA: Insufficient documentation

## 2016-10-26 DIAGNOSIS — Z794 Long term (current) use of insulin: Secondary | ICD-10-CM | POA: Insufficient documentation

## 2016-10-26 DIAGNOSIS — E785 Hyperlipidemia, unspecified: Secondary | ICD-10-CM | POA: Insufficient documentation

## 2016-10-26 HISTORY — DX: Personal history of urinary calculi: Z87.442

## 2016-10-26 HISTORY — DX: Family history of other specified conditions: Z84.89

## 2016-10-26 HISTORY — PX: RADIOLOGY WITH ANESTHESIA: SHX6223

## 2016-10-26 LAB — CBC
HEMATOCRIT: 47.5 % (ref 39.0–52.0)
Hemoglobin: 16.6 g/dL (ref 13.0–17.0)
MCH: 31.6 pg (ref 26.0–34.0)
MCHC: 34.9 g/dL (ref 30.0–36.0)
MCV: 90.3 fL (ref 78.0–100.0)
PLATELETS: 283 10*3/uL (ref 150–400)
RBC: 5.26 MIL/uL (ref 4.22–5.81)
RDW: 12.2 % (ref 11.5–15.5)
WBC: 10.2 10*3/uL (ref 4.0–10.5)

## 2016-10-26 LAB — PROTIME-INR
INR: 0.89
Prothrombin Time: 12 seconds (ref 11.4–15.2)

## 2016-10-26 LAB — BASIC METABOLIC PANEL
Anion gap: 8 (ref 5–15)
BUN: 17 mg/dL (ref 6–20)
CALCIUM: 9.7 mg/dL (ref 8.9–10.3)
CO2: 26 mmol/L (ref 22–32)
CREATININE: 1.28 mg/dL — AB (ref 0.61–1.24)
Chloride: 103 mmol/L (ref 101–111)
GFR calc Af Amer: >60 mL/min (ref >60)
GFR, EST NON AFRICAN AMERICAN: 60 mL/min — AB (ref >60)
GLUCOSE: 211 mg/dL — AB (ref 65–99)
POTASSIUM: 3.3 mmol/L — AB (ref 3.5–5.1)
SODIUM: 137 mmol/L (ref 135–145)

## 2016-10-26 LAB — GLUCOSE, CAPILLARY: Glucose-Capillary: 222 mg/dL — ABNORMAL HIGH (ref 65–99)

## 2016-10-26 LAB — PLATELET INHIBITION P2Y12: PLATELET FUNCTION P2Y12: 33 [PRU] — AB (ref 194–418)

## 2016-10-26 LAB — APTT: aPTT: 31 seconds (ref 24–36)

## 2016-10-26 SURGERY — RADIOLOGY WITH ANESTHESIA
Anesthesia: General | Laterality: Left

## 2016-10-26 MED ORDER — SODIUM CHLORIDE 0.9 % IV SOLN: INTRAVENOUS | Status: DC

## 2016-10-26 NOTE — Progress Notes (Signed)
Patient ID: Michael Hoffman, male   DOB: 05/31/1955, 61 y.o.   MRN: [id number]   Scheduled for Left vertebral artery angioplasty/stent placement  Unfortunately, Dr Corliss Skainseveshwar not well this am Will reschedule for later in week possibly  Pt aware and understandable; agreeable

## 2016-10-26 NOTE — H&P (Signed)
Chief Complaint: Patient was seen in consultation today for cerebral arteriogram with possible Left Vertebral artery angioplasty/stent at the request of Dr Early Osmond  Referring Physician(s): Dr Early Osmond  Supervising Physician: Julieanne Cotton  Patient Status: [facility] - Out-pt  History of Present Illness: Michael Hoffman is a 61 y.o. male   Hx CVA 03/2016 Confusion and balance abnormality Slow speech and minimal confusion Work up revealed CVA  Cerebral arteriogram 10/02/16:  IMPRESSION: 90% plus stenoses of the dominant left vertebral artery at its origin with mild poststenotic dilatation. Angiographic occlusion of the right vertebral artery with distal reconstitution from musculoskeletal branches of the ascending cervical branch of the right thyrocervical trunk. 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 Approximately 50% stenosis of the right internal carotid artery supraclinoid segment. Moderate intracranial arteriosclerotic changes involving the middle cerebral artery distributions, and the right vertebrobasilar and the PICA distributions, and also of the left posterior cerebral Arteries.  Pt states he is taking ASA and Plavix daily Symptoms of imbalance still an issue- falls occasionally at home Does not use walker or cane.  Now scheduled for angioplasty/stent of Left vertebral artery stenosis   Past Medical History:  Diagnosis Date  . Carotid artery occlusion   . Chronic kidney disease   . Diabetes mellitus without complication (HCC)    Type II  . Diabetic retinopathy (HCC)   . Dyslipidemia   . Family history of adverse reaction to anesthesia    mother had hard time waking up  . Heart disease   . History of kidney stones    passed some, lithrotrispy  . Stenosis of both vertebral arteries   . Stroke (HCC)    x 2 some memory loss    Past Surgical History:  Procedure Laterality Date  .  APPENDECTOMY    . COLONOSCOPY    . EYE SURGERY Bilateral    cataract surgery with lens implant  . IR ANGIO INTRA EXTRACRAN SEL COM CAROTID INNOMINATE BILAT MOD SED  10/02/2016  . IR ANGIO VERTEBRAL SEL SUBCLAVIAN INNOMINATE BILAT MOD SED  10/02/2016    Allergies: No known allergies  Medications: Prior to Admission medications   Medication Sig Start Date End Date Taking? Authorizing Provider  amLODipine (NORVASC) 10 MG tablet Take 10 mg by mouth daily.   Yes [provider]  aspirin EC 81 MG tablet Take 81 mg by mouth daily.   Yes [provider]  carvedilol (COREG) 25 MG tablet Take 25 mg by mouth 2 (two) times daily with a meal.   Yes [provider]  citalopram (CELEXA) 10 MG tablet Take 10 mg by mouth daily.   Yes [provider]  cloNIDine (CATAPRES) 0.1 MG tablet Take 0.1 mg by mouth 2 (two) times daily.   Yes [provider]  clopidogrel (PLAVIX) 75 MG tablet Take 75 mg by mouth daily.   Yes [provider]  donepezil (ARICEPT) 10 MG tablet Take 10 mg by mouth at bedtime.   Yes [provider]  insulin glargine (LANTUS) 100 UNIT/ML injection Inject 10 Units into the skin at bedtime.   Yes [provider]  lisinopril (PRINIVIL,ZESTRIL) 40 MG tablet Take 40 mg by mouth daily.   Yes [provider]  metFORMIN (GLUCOPHAGE) 1000 MG tablet Take 500 mg by mouth 2 (two) times daily with a meal.    Yes [provider]  simvastatin (ZOCOR) 20 MG tablet Take 20 mg  by mouth daily.   Yes [provider]     History reviewed. No pertinent family history.  Social History   Social History  . Marital status: Married    Spouse name: N/A  . Number of children: N/A  . Years of education: N/A   Social History Main Topics  . Smoking status: Never Smoker  . Smokeless tobacco: Current User    Types: Chew  . Alcohol use No  . Drug use: No  . Sexual activity: Not Asked   Other Topics Concern  .  None   Social History Narrative  . None    Review of Systems: A 12 point ROS discussed and pertinent positives are indicated in the HPI above.  All other systems are negative.  Review of Systems  Constitutional: Negative for activity change, appetite change, fatigue and fever.  HENT: Negative for tinnitus and trouble swallowing.   Eyes: Negative for visual disturbance.  Respiratory: Negative for cough and shortness of breath.   Cardiovascular: Negative for chest pain.  Gastrointestinal: Negative for abdominal pain, nausea and vomiting.  Musculoskeletal: Negative for back pain and gait problem.  Neurological: Positive for light-headedness. Negative for dizziness, tremors, seizures, syncope, facial asymmetry, speech difficulty, weakness, numbness and headaches.       Occasional light headed; imbalanced  Psychiatric/Behavioral: Negative for behavioral problems and confusion.    Vital Signs: BP 131/62   Pulse (!) 49   Temp 98 F (36.7 C) (Oral)   Resp 18   Ht 5\' 6"  (1.676 m)   Wt 190 lb (86.2 kg)   SpO2 97%   BMI 30.67 kg/m   Physical Exam  Constitutional: He is oriented to person, place, and time. He appears well-nourished.  HENT:  Head: Atraumatic.  Eyes: EOM are normal.  Neck: Neck supple.  Cardiovascular: Normal rate and regular rhythm.   Murmur heard. Pulmonary/Chest: Effort normal and breath sounds normal. He has no wheezes.  Abdominal: Soft. Bowel sounds are normal. There is no tenderness.  Musculoskeletal: Normal range of motion. He exhibits no edema, tenderness or deformity.  Neurological: He is alert and oriented to person, place, and time.  Skin: Skin is warm and dry.  Psychiatric: He has a normal mood and affect. His behavior is normal. Judgment and thought content normal.  Nursing note and vitals reviewed.   Mallampati Score:  MD Evaluation Airway: WNL Heart: WNL Abdomen: WNL Chest/ Lungs: WNL ASA  Classification: 3 Mallampati/Airway Score:  One  Imaging: Ir Angio Intra Extracran Sel Com Carotid Innominate Bilat Mod Sed  Result Date: 10/06/2016 INDICATION: Vertebrobasilar ischemic symptoms.  Gait imbalance.  Dizziness. EXAM: BILATERAL COMMON CAROTID AND INNOMINATE ANGIOGRAPHY AND BILATERAL VERTEBRAL ARTERY ANGIOGRAMS MEDICATIONS: Heparin 1000 units IV. No antibiotic was administered within 1 hour of the procedure. ANESTHESIA/SEDATION: Versed 1 mg IV; Fentanyl 25 mcg IV. Moderate Sedation Time:  30 minutes. The patient was continuously monitored during the procedure by the interventional radiology nurse under my direct supervision. FLUOROSCOPY TIME:  Fluoroscopy Time: 8 minutes 24 seconds (167 mGy). COMPLICATIONS: None immediate. TECHNIQUE: Informed written consent was obtained from the patient after a thorough discussion of the procedural risks, benefits and alternatives. All questions were addressed. Maximal Sterile Barrier Technique was utilized including caps, mask, sterile gowns, sterile gloves, sterile drape, hand hygiene and skin antiseptic. A timeout was performed prior to the initiation of the procedure. PROCEDURE: The right groin was prepped and draped in the usual sterile fashion. Thereafter using modified Seldinger technique, transfemoral access into the right  common femoral artery was obtained without difficulty. Over a 0.035 inch guidewire, a 5 French Pinnacle sheath was inserted. Through this, and also over 0.035 inch guidewire, a 5 French JB1 catheter was advanced to the aortic arch region and selectively positioned in the right common carotid artery, the right subclavian artery, the left common carotid artery and the left subclavian artery. FINDINGS: The right common carotid arteriogram demonstrates a smooth segmental atherosclerotic plaque involving the medial aspect of the middle 1/3 of the right common carotid artery. The right external carotid artery and its major branches are normally opacified. The right internal carotid  artery at the bulb demonstrates mild atherosclerotic irregularity along its posterior wall without significant stenoses. No intraluminal filling defects are seen. The vessel is, otherwise, seen to opacify normally to the cranial skull base. The petrous and the cavernous segments are widely patent. There is approximately 50% stenosis of the right internal carotid artery supraclinoid segment. The right middle cerebellar artery and the right anterior cerebral artery opacify into the capillary and venous phases. Focal areas of mild caliber irregularity and narrowing are noted involving the inferior division of the right middle cerebral artery and the distal right M1 segment, with a 50% stenosis of the right middle cerebral artery. The right subclavian arteriogram demonstrates angiographic occlusion of the right vertebral artery. There is reconstitution of the right vertebral artery at the level of C2 from the branches of the ascending cervical branch of the right thyrocervical trunk. Opacification is noted into the right vertebrobasilar junction and the right posterior-inferior cerebellar artery. Both these demonstrate focal areas of caliber irregularity with narrowing consistent with intracranial arteriosclerosis. Flow is noted into the distal right vertebrobasilar junction and subsequently opacification with mixing of non-opacified blood in the basilar artery. Partial retrograde opacification of the mid right vertebral artery is noted. Also demonstrated is a 75% stenosis of the right subclavian artery just distal to the origin of the thyrocervical trunk. The left common carotid arteriogram demonstrates the left external carotid artery and its major branches to be widely patent. Focal signal and shelf-like plaque is noted involving the distal left common carotid artery. The left external carotid artery and its major branches are widely patent. The left internal carotid artery at the bulb has a focal shelf-like plaque  along its inferior aspect without significant stenosis by the NASCET criteria. The left internal carotid artery is seen to opacify to the cranial skull base. The petrous segment is widely patent. There is mild stenosis of the distal cavernous segment of the left internal carotid artery. The supraclinoid segment is widely patent. The left middle cerebral artery and the left anterior cerebral artery are seen to opacify into the capillary and venous phases. Focal areas of caliber irregularity and narrowing are seen of the anterior temporal branch and the superior and inferior M2 branches of the left middle cerebral artery. The dominant left vertebral artery origin demonstrates a 90% plus stenoses with mild poststenotic dilatation. Antegrade flow is noted distally to the left vertebrobasilar junction. No definitive left posterior-inferior cerebellar artery is identified. However, there is a suggestion of a left anterior-inferior cerebellar artery/posterior inferior cerebellar artery complex. The basilar artery, the proximal posterior cerebral artery, the superior cerebellar arteries and the anterior-inferior cerebellar are seen to opacify into the capillary and venous phases. Focal areas of mild-to-moderate caliber irregularity are seen involving the right posterior cerebral artery P1 segment and to a less degree the left P1 segment. IMPRESSION: 90% plus stenoses of the dominant left vertebral  artery at its origin with mild poststenotic dilatation. Angiographic occlusion of the right vertebral artery with distal reconstitution from musculoskeletal branches of the ascending cervical branch of the right thyrocervical trunk. 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 Approximately 50% stenosis of the right internal carotid artery supraclinoid segment. Moderate intracranial arteriosclerotic changes involving the middle cerebral artery distributions, and the  right vertebrobasilar and the PICA distributions, and also of the left posterior cerebral arteries. Angiographic findings were reviewed with the patient and the patient's spouse. Electronically Signed   By: Julieanne Cotton M.D.   On: 10/02/2016 10:36   Ir Angio Vertebral Sel Subclavian Innominate Bilat Mod Sed  Result Date: 10/06/2016 INDICATION: Vertebrobasilar ischemic symptoms.  Gait imbalance.  Dizziness. EXAM: BILATERAL COMMON CAROTID AND INNOMINATE ANGIOGRAPHY AND BILATERAL VERTEBRAL ARTERY ANGIOGRAMS MEDICATIONS: Heparin 1000 units IV. No antibiotic was administered within 1 hour of the procedure. ANESTHESIA/SEDATION: Versed 1 mg IV; Fentanyl 25 mcg IV. Moderate Sedation Time:  30 minutes. The patient was continuously monitored during the procedure by the interventional radiology nurse under my direct supervision. FLUOROSCOPY TIME:  Fluoroscopy Time: 8 minutes 24 seconds (167 mGy). COMPLICATIONS: None immediate. TECHNIQUE: Informed written consent was obtained from the patient after a thorough discussion of the procedural risks, benefits and alternatives. All questions were addressed. Maximal Sterile Barrier Technique was utilized including caps, mask, sterile gowns, sterile gloves, sterile drape, hand hygiene and skin antiseptic. A timeout was performed prior to the initiation of the procedure. PROCEDURE: The right groin was prepped and draped in the usual sterile fashion. Thereafter using modified Seldinger technique, transfemoral access into the right common femoral artery was obtained without difficulty. Over a 0.035 inch guidewire, a 5 French Pinnacle sheath was inserted. Through this, and also over 0.035 inch guidewire, a 5 French JB1 catheter was advanced to the aortic arch region and selectively positioned in the right common carotid artery, the right subclavian artery, the left common carotid artery and the left subclavian artery. FINDINGS: The right common carotid arteriogram demonstrates a  smooth segmental atherosclerotic plaque involving the medial aspect of the middle 1/3 of the right common carotid artery. The right external carotid artery and its major branches are normally opacified. The right internal carotid artery at the bulb demonstrates mild atherosclerotic irregularity along its posterior wall without significant stenoses. No intraluminal filling defects are seen. The vessel is, otherwise, seen to opacify normally to the cranial skull base. The petrous and the cavernous segments are widely patent. There is approximately 50% stenosis of the right internal carotid artery supraclinoid segment. The right middle cerebellar artery and the right anterior cerebral artery opacify into the capillary and venous phases. Focal areas of mild caliber irregularity and narrowing are noted involving the inferior division of the right middle cerebral artery and the distal right M1 segment, with a 50% stenosis of the right middle cerebral artery. The right subclavian arteriogram demonstrates angiographic occlusion of the right vertebral artery. There is reconstitution of the right vertebral artery at the level of C2 from the branches of the ascending cervical branch of the right thyrocervical trunk. Opacification is noted into the right vertebrobasilar junction and the right posterior-inferior cerebellar artery. Both these demonstrate focal areas of caliber irregularity with narrowing consistent with intracranial arteriosclerosis. Flow is noted into the distal right vertebrobasilar junction and subsequently opacification with mixing of non-opacified blood in the basilar artery. Partial retrograde opacification of the mid right vertebral artery is noted. Also demonstrated  is a 75% stenosis of the right subclavian artery just distal to the origin of the thyrocervical trunk. The left common carotid arteriogram demonstrates the left external carotid artery and its major branches to be widely patent. Focal signal  and shelf-like plaque is noted involving the distal left common carotid artery. The left external carotid artery and its major branches are widely patent. The left internal carotid artery at the bulb has a focal shelf-like plaque along its inferior aspect without significant stenosis by the NASCET criteria. The left internal carotid artery is seen to opacify to the cranial skull base. The petrous segment is widely patent. There is mild stenosis of the distal cavernous segment of the left internal carotid artery. The supraclinoid segment is widely patent. The left middle cerebral artery and the left anterior cerebral artery are seen to opacify into the capillary and venous phases. Focal areas of caliber irregularity and narrowing are seen of the anterior temporal branch and the superior and inferior M2 branches of the left middle cerebral artery. The dominant left vertebral artery origin demonstrates a 90% plus stenoses with mild poststenotic dilatation. Antegrade flow is noted distally to the left vertebrobasilar junction. No definitive left posterior-inferior cerebellar artery is identified. However, there is a suggestion of a left anterior-inferior cerebellar artery/posterior inferior cerebellar artery complex. The basilar artery, the proximal posterior cerebral artery, the superior cerebellar arteries and the anterior-inferior cerebellar are seen to opacify into the capillary and venous phases. Focal areas of mild-to-moderate caliber irregularity are seen involving the right posterior cerebral artery P1 segment and to a less degree the left P1 segment. IMPRESSION: 90% plus stenoses of the dominant left vertebral artery at its origin with mild poststenotic dilatation. Angiographic occlusion of the right vertebral artery with distal reconstitution from musculoskeletal branches of the ascending cervical branch of the right thyrocervical trunk. 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 Approximately 50% stenosis of the right internal carotid artery supraclinoid segment. Moderate intracranial arteriosclerotic changes involving the middle cerebral artery distributions, and the right vertebrobasilar and the PICA distributions, and also of the left posterior cerebral arteries. Angiographic findings were reviewed with the patient and the patient's spouse. Electronically Signed   By: Julieanne CottonSanjeev  Deveshwar M.D.   On: 10/02/2016 10:36    Labs:  CBC:  Recent Labs  10/02/16 0707 10/26/16 0650  WBC 10.2 10.2  HGB 16.0 16.6  HCT 45.9 47.5  PLT 261 283    COAGS:  Recent Labs  10/02/16 0707  INR 0.93  APTT 31    BMP:  Recent Labs  10/02/16 0707  NA 141  K 3.5  CL 105  CO2 28  GLUCOSE 100*  BUN 21*  CALCIUM 9.4  CREATININE 1.48*  GFRNONAA 50*  GFRAA 58*    LIVER FUNCTION TESTS: No results for input(s): BILITOT, AST, ALT, ALKPHOS, PROT, ALBUMIN in the last 8760 hours.  TUMOR MARKERS: No results for input(s): AFPTM, CEA, CA199, CHROMGRNA in the last 8760 hours.  Assessment and Plan:  Hx CVA  Left Vertebral artery stenosis Scheduled for cerebral arteriogram with possible L vertebral artery angioplasty/stent placement In IR with Dr Corliss Skainseveshwar Risks and Benefits discussed with the patient including, but not limited to bleeding, infection, vascular injury, contrast induced renal failure, stroke or even death. All of the patient's questions were answered, patient is agreeable to proceed. Consent signed and in chart.  Pt is aware he will be admitted into Neuro ICU after intervention  for overnight stay. Plan for discharge in am if stable.  Thank you for this interesting consult.  I greatly enjoyed meeting Satvik R Colao and look forward to participating in their care.  A copy of this report was sent to the requesting provider on this date.  Electronically Signed: Robet Leu, PA-C 10/26/2016, 7:28 AM   I spent a total  of  30 Minutes   in face to face in clinical consultation, greater than 50% of which was counseling/coordinating care for L VA angioplasty/stent

## 2016-10-27 ENCOUNTER — Encounter (HOSPITAL_COMMUNITY): Payer: Self-pay | Admitting: Interventional Radiology

## 2016-10-27 ENCOUNTER — Other Ambulatory Visit: Payer: Self-pay | Admitting: Radiology

## 2016-10-27 LAB — HEMOGLOBIN A1C
HEMOGLOBIN A1C: 9.3 % — AB (ref 4.8–5.6)
MEAN PLASMA GLUCOSE: 220 mg/dL

## 2016-10-30 ENCOUNTER — Encounter (HOSPITAL_COMMUNITY): Payer: Self-pay | Admitting: *Deleted

## 2016-10-30 ENCOUNTER — Other Ambulatory Visit: Payer: Self-pay | Admitting: Radiology

## 2016-10-30 ENCOUNTER — Other Ambulatory Visit: Payer: Self-pay | Admitting: General Surgery

## 2016-10-30 NOTE — Progress Notes (Signed)
Pt denies SOB and chest pain but is under the care of Dr. Vonita MossPeterson, Cardiology. Pt denies having a stress test but stated that a cardiac cath was performed 15 years ago. Pt made aware to stop taking vitamins, fish oil and herbal medications. Do not take any NSAIDs ie: Ibuprofen, Advil, Naproxen, BC and Goody Powder. Pt made aware to take 5 units of Lantus insulin the night before surgery and no Metformin the morning of procedure. Pt made aware to Pt made aware to check BG every 2 hours prior to arrival to hospital on DOS. Pt made aware to treat a BG < 70 with ounces of apple  juice, wait 15 minutes after intervention to recheck BG, if BG remains < 70, call Short Stay unit to speak with a nurse.

## 2016-11-02 ENCOUNTER — Ambulatory Visit (HOSPITAL_COMMUNITY)
Admission: RE | Admit: 2016-11-02 | Discharge: 2016-11-02 | Disposition: A | Discharge status: Home / Self Care | Payer: Commercial Managed Care - PPO | Source: Ambulatory Visit | Attending: Interventional Radiology | Admitting: Interventional Radiology

## 2016-11-02 ENCOUNTER — Ambulatory Visit (HOSPITAL_COMMUNITY): Payer: Commercial Managed Care - PPO | Admitting: Anesthesiology

## 2016-11-02 ENCOUNTER — Encounter (HOSPITAL_COMMUNITY)
Admission: RE | Disposition: A | Discharge status: Home / Self Care | Payer: Self-pay | Source: Ambulatory Visit | Attending: Interventional Radiology

## 2016-11-02 ENCOUNTER — Observation Stay (HOSPITAL_COMMUNITY)
Admission: RE | Admit: 2016-11-02 | Discharge: 2016-11-03 | DRG: 039 | Disposition: A | Discharge status: Home / Self Care | Payer: Commercial Managed Care - PPO | Source: Ambulatory Visit | Attending: Interventional Radiology | Admitting: Interventional Radiology

## 2016-11-02 DIAGNOSIS — I739 Peripheral vascular disease, unspecified: Secondary | ICD-10-CM

## 2016-11-02 DIAGNOSIS — E11319 Type 2 diabetes mellitus with unspecified diabetic retinopathy without macular edema: Secondary | ICD-10-CM | POA: Diagnosis present

## 2016-11-02 DIAGNOSIS — Z7902 Long term (current) use of antithrombotics/antiplatelets: Secondary | ICD-10-CM | POA: Diagnosis not present

## 2016-11-02 DIAGNOSIS — I779 Disorder of arteries and arterioles, unspecified: Secondary | ICD-10-CM

## 2016-11-02 DIAGNOSIS — I6502 Occlusion and stenosis of left vertebral artery: Principal | ICD-10-CM | POA: Diagnosis present

## 2016-11-02 DIAGNOSIS — I672 Cerebral atherosclerosis: Secondary | ICD-10-CM | POA: Diagnosis present

## 2016-11-02 DIAGNOSIS — I771 Stricture of artery: Secondary | ICD-10-CM

## 2016-11-02 DIAGNOSIS — Z794 Long term (current) use of insulin: Secondary | ICD-10-CM | POA: Diagnosis not present

## 2016-11-02 DIAGNOSIS — Z8673 Personal history of transient ischemic attack (TIA), and cerebral infarction without residual deficits: Secondary | ICD-10-CM | POA: Diagnosis not present

## 2016-11-02 DIAGNOSIS — F17220 Nicotine dependence, chewing tobacco, uncomplicated: Secondary | ICD-10-CM | POA: Diagnosis present

## 2016-11-02 DIAGNOSIS — Z7982 Long term (current) use of aspirin: Secondary | ICD-10-CM | POA: Diagnosis not present

## 2016-11-02 DIAGNOSIS — G450 Vertebro-basilar artery syndrome: Secondary | ICD-10-CM | POA: Diagnosis present

## 2016-11-02 DIAGNOSIS — I63019 Cerebral infarction due to thrombosis of unspecified vertebral artery: Secondary | ICD-10-CM

## 2016-11-02 HISTORY — PX: RADIOLOGY WITH ANESTHESIA: SHX6223

## 2016-11-02 HISTORY — PX: IR TRANSCATH EXCRAN VERT OR CAR A STENT: IMG1955

## 2016-11-02 LAB — PROTIME-INR
INR: 0.94
PROTHROMBIN TIME: 12.5 s (ref 11.4–15.2)

## 2016-11-02 LAB — GLUCOSE, CAPILLARY
GLUCOSE-CAPILLARY: 140 mg/dL — AB (ref 65–99)
GLUCOSE-CAPILLARY: 101 mg/dL — AB (ref 65–99)
GLUCOSE-CAPILLARY: 108 mg/dL — AB (ref 65–99)
Glucose-Capillary: 105 mg/dL — ABNORMAL HIGH (ref 65–99)
Glucose-Capillary: 154 mg/dL — ABNORMAL HIGH (ref 65–99)
Glucose-Capillary: 174 mg/dL — ABNORMAL HIGH (ref 65–99)
Glucose-Capillary: 66 mg/dL (ref 65–99)

## 2016-11-02 LAB — COMPREHENSIVE METABOLIC PANEL
ALT: 17 U/L (ref 17–63)
AST: 22 U/L (ref 15–41)
Albumin: 3.8 g/dL (ref 3.5–5.0)
Alkaline Phosphatase: 86 U/L (ref 38–126)
Anion gap: 10 (ref 5–15)
BUN: 19 mg/dL (ref 6–20)
CALCIUM: 9.8 mg/dL (ref 8.9–10.3)
CO2: 27 mmol/L (ref 22–32)
CREATININE: 1.34 mg/dL — AB (ref 0.61–1.24)
Chloride: 101 mmol/L (ref 101–111)
GFR, EST NON AFRICAN AMERICAN: 56 mL/min — AB (ref >60)
Glucose, Bld: 106 mg/dL — ABNORMAL HIGH (ref 65–99)
Potassium: 3.7 mmol/L (ref 3.5–5.1)
SODIUM: 138 mmol/L (ref 135–145)
TOTAL PROTEIN: 7.4 g/dL (ref 6.5–8.1)
Total Bilirubin: 1.4 mg/dL — ABNORMAL HIGH (ref 0.3–1.2)

## 2016-11-02 LAB — CBC WITH DIFFERENTIAL/PLATELET
Basophils Absolute: 0.1 10*3/uL (ref 0.0–0.1)
Basophils Relative: 1 %
EOS ABS: 0.5 10*3/uL (ref 0.0–0.7)
Eosinophils Relative: 5 %
HCT: 48.5 % (ref 39.0–52.0)
HEMOGLOBIN: 16.8 g/dL (ref 13.0–17.0)
LYMPHS PCT: 21 %
Lymphs Abs: 2.4 10*3/uL (ref 0.7–4.0)
MCH: 31.5 pg (ref 26.0–34.0)
MCHC: 34.6 g/dL (ref 30.0–36.0)
MCV: 91 fL (ref 78.0–100.0)
MONOS PCT: 6 %
Monocytes Absolute: 0.7 10*3/uL (ref 0.1–1.0)
NEUTROS PCT: 67 %
Neutro Abs: 7.7 10*3/uL (ref 1.7–7.7)
Platelets: 353 10*3/uL (ref 150–400)
RBC: 5.33 MIL/uL (ref 4.22–5.81)
RDW: 12.4 % (ref 11.5–15.5)
WBC: 11.4 10*3/uL — ABNORMAL HIGH (ref 4.0–10.5)

## 2016-11-02 LAB — HEPARIN LEVEL (UNFRACTIONATED)

## 2016-11-02 LAB — APTT: aPTT: 33 seconds (ref 24–36)

## 2016-11-02 LAB — MRSA PCR SCREENING: MRSA BY PCR: NEGATIVE

## 2016-11-02 LAB — POCT ACTIVATED CLOTTING TIME
ACTIVATED CLOTTING TIME: 175 s
Activated Clotting Time: 180 seconds

## 2016-11-02 LAB — PLATELET INHIBITION P2Y12: PLATELET FUNCTION P2Y12: 56 [PRU] — AB (ref 194–418)

## 2016-11-02 SURGERY — RADIOLOGY WITH ANESTHESIA
Anesthesia: General | Laterality: Left

## 2016-11-02 MED ORDER — GUAIFENESIN 100 MG/5ML PO SOLN
5.0000 mL | ORAL | Status: DC | PRN
  Administered 2016-11-02: 200 mg via ORAL
  Filled 2016-11-02: qty 10

## 2016-11-02 MED ORDER — IOPAMIDOL (ISOVUE-300) INJECTION 61%
INTRAVENOUS | Status: AC
  Administered 2016-11-02: 120 mL via INTRA_ARTERIAL
  Filled 2016-11-02: qty 300

## 2016-11-02 MED ORDER — ONDANSETRON HCL 4 MG/2ML IJ SOLN
4.0000 mg | Freq: Four times a day (QID) | INTRAMUSCULAR | Status: DC | PRN
  Administered 2016-11-02: 4 mg via INTRAVENOUS
  Filled 2016-11-02: qty 2

## 2016-11-02 MED ORDER — CEFAZOLIN SODIUM-DEXTROSE 2-4 GM/100ML-% IV SOLN
2.0000 g | INTRAVENOUS | Status: AC
  Administered 2016-11-02: 2 g via INTRAVENOUS

## 2016-11-02 MED ORDER — MIDAZOLAM HCL 5 MG/5ML IJ SOLN
INTRAMUSCULAR | Status: DC | PRN
Start: 2016-11-02 — End: 2016-11-02
  Administered 2016-11-02: 0.5 mg via INTRAVENOUS
  Administered 2016-11-02: 1 mg via INTRAVENOUS
  Administered 2016-11-02: 0.5 mg via INTRAVENOUS

## 2016-11-02 MED ORDER — ASPIRIN 325 MG PO TABS
325.0000 mg | ORAL_TABLET | Freq: Every day | ORAL | Status: DC
  Administered 2016-11-03: 325 mg via ORAL
  Filled 2016-11-02: qty 1

## 2016-11-02 MED ORDER — SODIUM CHLORIDE 0.9 % IV SOLN
INTRAVENOUS | Status: DC | PRN
  Administered 2016-11-02: 09:00:00 via INTRAVENOUS

## 2016-11-02 MED ORDER — ALPRAZOLAM 0.25 MG PO TABS
0.2500 mg | ORAL_TABLET | Freq: Once | ORAL | Status: AC
  Administered 2016-11-02: 0.25 mg via ORAL
  Filled 2016-11-02: qty 1

## 2016-11-02 MED ORDER — DONEPEZIL HCL 10 MG PO TABS
10.0000 mg | ORAL_TABLET | Freq: Every day | ORAL | Status: DC
  Administered 2016-11-02: 10 mg via ORAL
  Filled 2016-11-02: qty 1

## 2016-11-02 MED ORDER — DEXTROSE 50 % IV SOLN
25.0000 mL | Freq: Once | INTRAVENOUS | Status: AC
  Administered 2016-11-02: 25 mL via INTRAVENOUS

## 2016-11-02 MED ORDER — DEXTROSE 50 % IV SOLN
INTRAVENOUS | Status: AC
  Filled 2016-11-02: qty 50

## 2016-11-02 MED ORDER — ASPIRIN EC 325 MG PO TBEC: 325.0000 mg | DELAYED_RELEASE_TABLET | ORAL | Status: DC

## 2016-11-02 MED ORDER — ACETAMINOPHEN 160 MG/5ML PO SOLN: 650.0000 mg | ORAL | Status: DC | PRN

## 2016-11-02 MED ORDER — LIDOCAINE HCL (PF) 1 % IJ SOLN
INTRAMUSCULAR | Status: AC
  Filled 2016-11-02: qty 30

## 2016-11-02 MED ORDER — HEPARIN SODIUM (PORCINE) 1000 UNIT/ML IJ SOLN
INTRAMUSCULAR | Status: DC | PRN
  Administered 2016-11-02: 3000 [IU] via INTRAVENOUS
  Administered 2016-11-02 (×3): 500 [IU] via INTRAVENOUS

## 2016-11-02 MED ORDER — CLOPIDOGREL BISULFATE 75 MG PO TABS
ORAL_TABLET | ORAL | Status: AC
  Administered 2016-11-02: 75 mg
  Filled 2016-11-02: qty 1

## 2016-11-02 MED ORDER — OXYCODONE HCL 5 MG/5ML PO SOLN
5.0000 mg | Freq: Once | ORAL | Status: DC | PRN
Start: 2016-11-02 — End: 2016-11-02

## 2016-11-02 MED ORDER — HEPARIN (PORCINE) IN NACL 100-0.45 UNIT/ML-% IJ SOLN
500.0000 [IU]/h | INTRAMUSCULAR | Status: DC
  Administered 2016-11-02: 500 [IU]/h via INTRAVENOUS
  Filled 2016-11-02: qty 250

## 2016-11-02 MED ORDER — FENTANYL CITRATE (PF) 100 MCG/2ML IJ SOLN
25.0000 ug | INTRAMUSCULAR | Status: DC | PRN
  Administered 2016-11-02: 50 ug via INTRAVENOUS
  Filled 2016-11-02: qty 2

## 2016-11-02 MED ORDER — INSULIN ASPART 100 UNIT/ML ~~LOC~~ SOLN
0.0000 [IU] | Freq: Three times a day (TID) | SUBCUTANEOUS | Status: DC
  Administered 2016-11-03: 2 [IU] via SUBCUTANEOUS

## 2016-11-02 MED ORDER — ONDANSETRON HCL 4 MG/2ML IJ SOLN: 4.0000 mg | Freq: Once | INTRAMUSCULAR | Status: DC | PRN

## 2016-11-02 MED ORDER — SODIUM CHLORIDE 0.9 % IV SOLN
INTRAVENOUS | Status: DC
  Administered 2016-11-02 (×2): via INTRAVENOUS

## 2016-11-02 MED ORDER — CARVEDILOL 12.5 MG PO TABS
25.0000 mg | ORAL_TABLET | Freq: Two times a day (BID) | ORAL | Status: DC
  Administered 2016-11-02 – 2016-11-03 (×2): 25 mg via ORAL
  Filled 2016-11-02 (×2): qty 2

## 2016-11-02 MED ORDER — CITALOPRAM HYDROBROMIDE 10 MG PO TABS
10.0000 mg | ORAL_TABLET | Freq: Every day | ORAL | Status: DC
  Administered 2016-11-03: 10 mg via ORAL
  Filled 2016-11-02: qty 1

## 2016-11-02 MED ORDER — CLOPIDOGREL BISULFATE 75 MG PO TABS
75.0000 mg | ORAL_TABLET | Freq: Every day | ORAL | Status: DC
  Administered 2016-11-03: 75 mg via ORAL
  Filled 2016-11-02: qty 1

## 2016-11-02 MED ORDER — LORAZEPAM 2 MG/ML IJ SOLN
1.0000 mg | Freq: Three times a day (TID) | INTRAMUSCULAR | Status: DC
  Administered 2016-11-02: 1 mg via INTRAVENOUS
  Filled 2016-11-02: qty 1

## 2016-11-02 MED ORDER — HEPARIN (PORCINE) IN NACL 100-0.45 UNIT/ML-% IJ SOLN
800.0000 [IU]/h | INTRAMUSCULAR | Status: AC
  Filled 2016-11-02: qty 250

## 2016-11-02 MED ORDER — CLONIDINE HCL 0.1 MG PO TABS
0.1000 mg | ORAL_TABLET | Freq: Two times a day (BID) | ORAL | Status: DC
  Administered 2016-11-02 – 2016-11-03 (×2): 0.1 mg via ORAL
  Filled 2016-11-02 (×2): qty 1

## 2016-11-02 MED ORDER — SODIUM CHLORIDE 0.9 % IV SOLN
INTRAVENOUS | Status: DC
  Administered 2016-11-02: 09:00:00 via INTRAVENOUS

## 2016-11-02 MED ORDER — ACETAMINOPHEN 325 MG PO TABS
650.0000 mg | ORAL_TABLET | ORAL | Status: DC | PRN
  Administered 2016-11-02: 650 mg via ORAL
  Filled 2016-11-02: qty 2

## 2016-11-02 MED ORDER — HEPARIN (PORCINE) IN NACL 100-0.45 UNIT/ML-% IJ SOLN
INTRAMUSCULAR | Status: AC
  Filled 2016-11-02: qty 250

## 2016-11-02 MED ORDER — OXYCODONE HCL 5 MG PO TABS: 5.0000 mg | ORAL_TABLET | Freq: Once | ORAL | Status: DC | PRN

## 2016-11-02 MED ORDER — SIMVASTATIN 20 MG PO TABS
20.0000 mg | ORAL_TABLET | Freq: Every day | ORAL | Status: DC
  Administered 2016-11-02 – 2016-11-03 (×2): 20 mg via ORAL
  Filled 2016-11-02 (×2): qty 1

## 2016-11-02 MED ORDER — FENTANYL CITRATE (PF) 100 MCG/2ML IJ SOLN
INTRAMUSCULAR | Status: DC | PRN
Start: 2016-11-02 — End: 2016-11-02
  Administered 2016-11-02 (×4): 25 ug via INTRAVENOUS

## 2016-11-02 MED ORDER — LISINOPRIL 20 MG PO TABS
40.0000 mg | ORAL_TABLET | Freq: Every day | ORAL | Status: DC
  Administered 2016-11-02 – 2016-11-03 (×2): 40 mg via ORAL
  Filled 2016-11-02 (×2): qty 2

## 2016-11-02 MED ORDER — FENTANYL CITRATE (PF) 100 MCG/2ML IJ SOLN: 25.0000 ug | INTRAMUSCULAR | Status: DC | PRN

## 2016-11-02 MED ORDER — CLOPIDOGREL BISULFATE 75 MG PO TABS: 75.0000 mg | ORAL_TABLET | ORAL | Status: DC

## 2016-11-02 MED ORDER — CEFAZOLIN SODIUM-DEXTROSE 2-4 GM/100ML-% IV SOLN
INTRAVENOUS | Status: AC
  Filled 2016-11-02: qty 100

## 2016-11-02 MED ORDER — HYDROCODONE-ACETAMINOPHEN 5-325 MG PO TABS
1.0000 | ORAL_TABLET | Freq: Four times a day (QID) | ORAL | Status: DC | PRN
  Administered 2016-11-02 – 2016-11-03 (×2): 2 via ORAL
  Filled 2016-11-02 (×2): qty 2

## 2016-11-02 MED ORDER — NITROGLYCERIN 1 MG/10 ML FOR IR/CATH LAB
INTRA_ARTERIAL | Status: AC
  Filled 2016-11-02: qty 10

## 2016-11-02 MED ORDER — EPTIFIBATIDE 20 MG/10ML IV SOLN
INTRAVENOUS | Status: AC
  Filled 2016-11-02: qty 10

## 2016-11-02 MED ORDER — NIMODIPINE 30 MG PO CAPS: 0.0000 mg | ORAL_CAPSULE | ORAL | Status: DC

## 2016-11-02 MED ORDER — ACETAMINOPHEN 650 MG RE SUPP: 650.0000 mg | RECTAL | Status: DC | PRN

## 2016-11-02 MED ORDER — ALPRAZOLAM 0.25 MG PO TABS: 0.2500 mg | ORAL_TABLET | Freq: Once | ORAL | Status: DC

## 2016-11-02 MED ORDER — CLEVIDIPINE BUTYRATE 0.5 MG/ML IV EMUL
0.0000 mg/h | INTRAVENOUS | Status: DC
  Administered 2016-11-02: 1 mg/h via INTRAVENOUS
  Administered 2016-11-02 (×2): 16 mg/h via INTRAVENOUS
  Administered 2016-11-02: 8 mg/h via INTRAVENOUS
  Administered 2016-11-02: 16 mg/h via INTRAVENOUS
  Administered 2016-11-03: 8 mg/h via INTRAVENOUS
  Administered 2016-11-03: 4 mg/h via INTRAVENOUS
  Filled 2016-11-02: qty 100
  Filled 2016-11-02 (×3): qty 50
  Filled 2016-11-02: qty 100
  Filled 2016-11-02: qty 50

## 2016-11-02 MED ORDER — AMLODIPINE BESYLATE 10 MG PO TABS
10.0000 mg | ORAL_TABLET | Freq: Every day | ORAL | Status: DC
  Administered 2016-11-03: 10 mg via ORAL
  Filled 2016-11-02: qty 1

## 2016-11-02 MED ORDER — ASPIRIN EC 325 MG PO TBEC
DELAYED_RELEASE_TABLET | ORAL | Status: AC
  Administered 2016-11-02: 325 mg
  Filled 2016-11-02: qty 1

## 2016-11-02 NOTE — H&P (Deleted)
  The note originally documented on this encounter has been moved the the encounter in which it belongs.  

## 2016-11-02 NOTE — H&P (Signed)
Chief Complaint: Patient was seen in consultation today for vertebral artery stenosis  Referring Physician(s):  Dr. Delia Heady  Supervising Physician: Julieanne Cotton  Patient Status: [facility] - Out-pt  History of Present Illness: Michael Hoffman is a 61 y.o. 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   Patient underwent arteriogram 10/02/16 which showed: 90% plus stenoses of the dominant left vertebral artery at its origin with mild poststenotic dilatation. Angiographic occlusion of the right vertebral artery with distal reconstitution from musculoskeletal branches of the ascending cervical branch of the right thyrocervical trunk. 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 Approximately 50% stenosis of the right internal carotid artery supraclinoid segment. Moderate intracranial arteriosclerotic changes involving the middle cerebral artery distributions, and the right vertebrobasilar and the PICA distributions, and also of the left posterior cerebral Arteries.  Patient scheduled for angioplasty/stent of the left vertebral artery stenosis.  He has been taking his Plavix and aspirin daily.  He last took last PM and will be given morning dose here today.  He has been feeling well and in his usual state of health.  He has been NPO.  Of note, patient also with renal insufficiency.   Past Medical History:  Diagnosis Date  . Carotid artery occlusion   . Chronic kidney disease   . Diabetes mellitus without complication (HCC)    Type II  . Diabetic retinopathy (HCC)   . Dyslipidemia   . Family history of adverse reaction to anesthesia    mother had hard time waking up  . Heart disease   . History of kidney stones    passed some, lithrotrispy  . Stenosis of both vertebral arteries   . Stroke (HCC)    x 2 some memory loss    Past Surgical History:  Procedure Laterality Date  .  APPENDECTOMY    . COLONOSCOPY    . EYE SURGERY Bilateral    cataract surgery with lens implant  . IR ANGIO INTRA EXTRACRAN SEL COM CAROTID INNOMINATE BILAT MOD SED  10/02/2016  . IR ANGIO VERTEBRAL SEL SUBCLAVIAN INNOMINATE BILAT MOD SED  10/02/2016  . RADIOLOGY WITH ANESTHESIA Left 10/26/2016   Procedure: RADIOLOGY WITH ANESTHESIA/STENT PLACEMENT;  Surgeon: Julieanne Cotton, MD;  Location: [facility] OR;  Service: Radiology;  Laterality: Left;    Allergies: No known allergies  Medications: Prior to Admission medications   Medication Sig Start Date End Date Taking? Authorizing Provider  amLODipine (NORVASC) 10 MG tablet Take 10 mg by mouth daily.    [provider]  aspirin EC 81 MG tablet Take 81 mg by mouth daily.    [provider]  carvedilol (COREG) 25 MG tablet Take 25 mg by mouth 2 (two) times daily with a meal.    [provider]  citalopram (CELEXA) 10 MG tablet Take 10 mg by mouth daily.    [provider]  cloNIDine (CATAPRES) 0.1 MG tablet Take 0.1 mg by mouth 2 (two) times daily.    [provider]  clopidogrel (PLAVIX) 75 MG tablet Take 75 mg by mouth daily.    [provider]  donepezil (ARICEPT) 10 MG tablet Take 10 mg by mouth at bedtime.    [provider]  insulin glargine (LANTUS) 100 UNIT/ML injection Inject 10 Units into the skin at bedtime.    [provider]  lisinopril (PRINIVIL,ZESTRIL) 40 MG tablet Take 40 mg by mouth daily.    [provider]  metFORMIN (GLUCOPHAGE) 1000 MG tablet Take 500 mg by mouth 2 (two) times daily with a meal.     [provider]  simvastatin (ZOCOR) 20 MG tablet Take 20 mg by mouth daily.    [provider]     No family history on file.  Social History   Social History  . Marital status: Married    Spouse name: N/A  . Number of children: N/A  . Years of education: N/A   Social History Main Topics  . Smoking status: Never Smoker  .  Smokeless tobacco: Current User    Types: Chew  . Alcohol use No  . Drug use: No  . Sexual activity: Not on file   Other Topics Concern  . Not on file   Social History Narrative  . No narrative on file    Review of Systems  Constitutional: Negative for fatigue and fever.  Respiratory: Negative for cough and shortness of breath.   Cardiovascular: Negative for chest pain.  Psychiatric/Behavioral: Negative for behavioral problems and confusion.    Vital Signs: There were no vitals taken for this visit.  Physical Exam  Constitutional: He is oriented to person, place, and time. He appears well-developed.  Cardiovascular: Normal rate and regular rhythm.   Murmur heard. Pulmonary/Chest: Effort normal and breath sounds normal. No respiratory distress.  Neurological: He is alert and oriented to person, place, and time.  Skin: Skin is warm and dry.  Psychiatric: He has a normal mood and affect. His behavior is normal. Judgment and thought content normal.  Nursing note and vitals reviewed.   Mallampati Score:  MD Evaluation Airway: WNL Heart: WNL Abdomen: WNL Chest/ Lungs: WNL ASA  Classification: 3  Imaging: No results found.  Labs:  CBC:  Recent Labs  10/02/16 0707 10/26/16 0650 11/02/16 0717  WBC 10.2 10.2 11.4*  HGB 16.0 16.6 16.8  HCT 45.9 47.5 48.5  PLT 261 283 353    COAGS:  Recent Labs  10/02/16 0707 10/26/16 0650 11/02/16 0717  INR 0.93 0.89 0.94  APTT 31 31 33    BMP:  Recent Labs  10/02/16 0707 10/26/16 0650  NA 141 137  K 3.5 3.3*  CL 105 103  CO2 28 26  GLUCOSE 100* 211*  BUN 21* 17  CALCIUM 9.4 9.7  CREATININE 1.48* 1.28*  GFRNONAA 50* 60*  GFRAA 58* >60    LIVER FUNCTION TESTS: No results for input(s): BILITOT, AST, ALT, ALKPHOS, PROT, ALBUMIN in the last 8760 hours.  TUMOR MARKERS: No results for input(s): AFPTM, CEA, CA199, CHROMGRNA in the last 8760 hours.  Assessment and Plan: Patient with history of CVA 03/2016  is found to have left vertebral artery stenosis on diagnostic angiogram 10/02/16.  Patient scheduled for vetebral artery stenosis and presents today in his usual state of health. Patient's WBC is slightly elevated above his usual, but he denies fever, chills, or malaise.  He has been feeling well.  Patient has been NPO. He last took Plavix and aspirin last PM.  He will be given AM dose here today.  Patient is aware he will be admitted overnight for observation.  Risks and benefits discussed with the patient including, but not limited to bleeding, infection, vascular injury or contrast induced renal failure. All of the patient's questions were answered, patient is agreeable to proceed. Consent signed and in chart.   Thank you for this interesting consult.  I greatly enjoyed meeting Saud R Ethington and look forward to  participating in their care.  A copy of this report was sent to the requesting provider on this date.  Electronically Signed: Hoyt Koch, PA 11/02/2016, 8:18 AM   I spent a total of    15 Minutes in face to face in clinical consultation, greater than 50% of which was counseling/coordinating care for vertebral artery stenosis.

## 2016-11-02 NOTE — Anesthesia Postprocedure Evaluation (Signed)
Anesthesia Post Note  Patient: Michael Hoffman  Procedure(s) Performed: Procedure(s) (LRB): RADIOLOGY WITH ANESTHESIA/LEFT VERTIBRAL ARTERY STENTING (Left)     Patient location during evaluation: PACU Anesthesia Type: MAC Level of consciousness: awake, awake and alert and oriented Pain management: pain level controlled Vital Signs Assessment: post-procedure vital signs reviewed and stable Respiratory status: spontaneous breathing, nonlabored ventilation and respiratory function stable Cardiovascular status: blood pressure returned to baseline Anesthetic complications: no    Last Vitals:  Vitals:   11/02/16 1700 11/02/16 1715  BP: 131/69 131/69  Pulse:  65  Resp:  17  Temp:      Last Pain:  Vitals:   11/02/16 1658  TempSrc:   PainSc: 5                  Kostantinos Tallman COKER

## 2016-11-02 NOTE — Progress Notes (Signed)
ANTICOAGULATION CONSULT NOTE - Initial Consult  Pharmacy Consult for heparin Indication: s/p cerebral angiogram  Allergies  Allergen Reactions  . No Known Allergies     Patient Measurements: Weight: 190 lb (86.2 kg) Heparin Dosing Weight: 81kg  Vital Signs: Temp: 97.4 F (36.3 C) (06/18 1242) Temp Source: Oral (06/18 0752) BP: 109/64 (06/18 1242) Pulse Rate: 49 (06/18 1242)  Labs:  Recent Labs  11/02/16 0717  HGB 16.8  HCT 48.5  PLT 353  APTT 33  LABPROT 12.5  INR 0.94  CREATININE 1.34*    Estimated Creatinine Clearance: 61.1 mL/min (A) (by C-G formula based on SCr of 1.34 mg/dL (H)).   Medical History: Past Medical History:  Diagnosis Date  . Carotid artery occlusion   . Chronic kidney disease   . Diabetes mellitus without complication (HCC)    Type II  . Diabetic retinopathy (HCC)   . Dyslipidemia   . Family history of adverse reaction to anesthesia    mother had hard time waking up  . Heart disease   . History of kidney stones    passed some, lithrotrispy  . Stenosis of both vertebral arteries   . Stroke (HCC)    x 2 some memory loss    Medications:  Infusions:  . sodium chloride    . clevidipine    . heparin    . heparin 500 Units/hr (11/02/16 1242)    Assessment: 60 yom s/p cerebral angiogram to start on IV heparin. Baseline CBC is WNL. He is not on anticoagulation PTA.   Goal of Therapy:  Heparin level 0.1-0.25 units/ml Monitor platelets by anticoagulation protocol: Yes   Plan:  Heparin gtt 500 units/hr Check an 8 hr heparin level *Heparin off at 0700 tomorrow  Sabirin Baray, Drake LeachRachel Lynn 11/02/2016,1:05 PM

## 2016-11-02 NOTE — Progress Notes (Signed)
Patient educated on how importance of keeping his leg straight. Patient noted to be noncompalint. Will continue to re-educate patient and monitor closely. Will notify the on coming RN.

## 2016-11-02 NOTE — Progress Notes (Signed)
ANTICOAGULATION CONSULT NOTE - Initial Consult  Pharmacy Consult for heparin Indication: s/p cerebral angiogram  Allergies  Allergen Reactions  . No Known Allergies     Patient Measurements: Height: 5\' 6"  (167.6 cm) Weight: 192 lb 7.4 oz (87.3 kg) IBW/kg (Calculated) : 63.8 Heparin Dosing Weight: 81kg  Vital Signs: Temp: 98.1 F (36.7 C) (06/18 2000) Temp Source: Oral (06/18 2000) BP: 136/77 (06/18 2200) Pulse Rate: 70 (06/18 2200)  Labs:  Recent Labs  11/02/16 0717 11/02/16 2116  HGB 16.8  --   HCT 48.5  --   PLT 353  --   APTT 33  --   LABPROT 12.5  --   INR 0.94  --   HEPARINUNFRC  --  <0.10*  CREATININE 1.34*  --     Estimated Creatinine Clearance: 61.5 mL/min (A) (by C-G formula based on SCr of 1.34 mg/dL (H)).   Medical History: Past Medical History:  Diagnosis Date  . Carotid artery occlusion   . Chronic kidney disease   . Diabetes mellitus without complication (HCC)    Type II  . Diabetic retinopathy (HCC)   . Dyslipidemia   . Family history of adverse reaction to anesthesia    mother had hard time waking up  . Heart disease   . History of kidney stones    passed some, lithrotrispy  . Stenosis of both vertebral arteries   . Stroke (HCC)    x 2 some memory loss    Medications:  Infusions:  . sodium chloride 75 mL/hr at 11/02/16 2122  . clevidipine 16 mg/hr (11/02/16 2118)  . heparin 500 Units/hr (11/02/16 1800)    Assessment: 61 yo M s/p cerebral angiogram on IV heparin. Heparin level is undetectable on 500 units/hr.  Will increase to 800 units/hr (~10 units/kg/hour).  Goal of Therapy:  Heparin level 0.1-0.25 units/ml Monitor platelets by anticoagulation protocol: Yes   Plan:  Increase heparin gtt 800 units/hr No plans for repeat heparin level as heparin is planned to stop at 0700 tomorrow  Toys 'R' UsKimberly Lejuan Botto, Pharm.D., BCPS Clinical Pharmacist Pager: [phone] 11/02/2016 10:58 PM

## 2016-11-02 NOTE — Anesthesia Preprocedure Evaluation (Addendum)
Anesthesia Evaluation  Patient identified by MRN, date of birth, ID band Patient awake    Reviewed: Allergy & Precautions, NPO status , Patient's Chart, lab work & pertinent test results  Airway Mallampati: II  TM Distance: >3 FB Neck ROM: Full    Dental  (+) Teeth Intact, Dental Advisory Given   Pulmonary    breath sounds clear to auscultation       Cardiovascular  Rhythm:Regular Rate:Normal     Neuro/Psych    GI/Hepatic   Endo/Other  diabetes  Renal/GU      Musculoskeletal   Abdominal   Peds  Hematology   Anesthesia Other Findings   Reproductive/Obstetrics                            Anesthesia Physical Anesthesia Plan  ASA: III  Anesthesia Plan: General   Post-op Pain Management:    Induction:   PONV Risk Score and Plan: Ondansetron and Propofol  Airway Management Planned: Oral ETT  Additional Equipment: Arterial line  Intra-op Plan:   Post-operative Plan: Extubation in OR  Informed Consent: I have reviewed the patients History and Physical, chart, labs and discussed the procedure including the risks, benefits and alternatives for the proposed anesthesia with the patient or authorized representative who has indicated his/her understanding and acceptance.   Dental advisory given  Plan Discussed with: CRNA and Anesthesiologist  Anesthesia Plan Comments:         Anesthesia Quick Evaluation

## 2016-11-02 NOTE — Progress Notes (Signed)
Referring Physician(s): Dr  Lina Sayre   Supervising Physician: Julieanne Cotton  Patient Status:  [facility] - In-pt  Chief Complaint:  L VA stenosis Angioplasty/stent in IR  Subjective:  Tolerated procedure well Does now complain of bifrontal headache  Started approx 10-15 minutes ago Around B eyes Denies N/V; Denies photophobia Has eaten lunch and has not had any vomiting  Dr Corliss Skains has seen and evaluated pt.  Allergies: No known allergies  Medications: Prior to Admission medications   Medication Sig Start Date End Date Taking? Authorizing Provider  amLODipine (NORVASC) 10 MG tablet Take 10 mg by mouth daily.   Yes [provider]  aspirin EC 81 MG tablet Take 81 mg by mouth daily.   Yes [provider]  carvedilol (COREG) 25 MG tablet Take 25 mg by mouth 2 (two) times daily with a meal.   Yes [provider]  citalopram (CELEXA) 10 MG tablet Take 10 mg by mouth daily.   Yes [provider]  cloNIDine (CATAPRES) 0.1 MG tablet Take 0.1 mg by mouth 2 (two) times daily.   Yes [provider]  clopidogrel (PLAVIX) 75 MG tablet Take 75 mg by mouth daily.   Yes [provider]  donepezil (ARICEPT) 10 MG tablet Take 10 mg by mouth at bedtime.   Yes [provider]  insulin glargine (LANTUS) 100 UNIT/ML injection Inject 10 Units into the skin at bedtime.   Yes [provider]  lisinopril (PRINIVIL,ZESTRIL) 40 MG tablet Take 40 mg by mouth daily.   Yes [provider]  metFORMIN (GLUCOPHAGE) 1000 MG tablet Take 500 mg by mouth 2 (two) times daily with a meal.    Yes [provider]  simvastatin (ZOCOR) 20 MG tablet Take 20 mg by mouth daily.   Yes [provider]     Vital Signs: BP 137/78   Pulse (!) 57   Temp 97.6 F (36.4 C) (Oral)   Resp 17   Ht 5\' 6"  (1.676 m)   Wt 192 lb 7.4 oz (87.3 kg)   SpO2 100%   BMI 31.06 kg/m   Physical Exam  Constitutional: He is oriented  to person, place, and time.  HENT:  Face symmetrical Tongue midline   Eyes: EOM are normal. Pupils are equal, round, and reactive to light.  Neck: Normal range of motion.  Abdominal: Soft. He exhibits no distension. There is no tenderness.  Musculoskeletal: Normal range of motion. He exhibits no edema or deformity.  Neurological: He is alert and oriented to person, place, and time.  Fine motor finger to nose B = No past pointing   Skin: Skin is warm and dry.  Right groin NT no bleeding Soft No hematoma Rt foot 2+ pulses   Psychiatric: He has a normal mood and affect. His behavior is normal.  Nursing note and vitals reviewed.   Imaging: No results found.  Labs:  CBC:  Recent Labs  10/02/16 0707 10/26/16 0650 11/02/16 0717  WBC 10.2 10.2 11.4*  HGB 16.0 16.6 16.8  HCT 45.9 47.5 48.5  PLT 261 283 353    COAGS:  Recent Labs  10/02/16 0707 10/26/16 0650 11/02/16 0717  INR 0.93 0.89 0.94  APTT 31 31 33    BMP:  Recent Labs  10/02/16 0707 10/26/16 0650 11/02/16 0717  NA 141 137 138  K 3.5 3.3* 3.7  CL 105 103 101  CO2 28 26 27   GLUCOSE 100* 211* 106*  BUN 21* 17 19  CALCIUM 9.4 9.7 9.8  CREATININE 1.48* 1.28* 1.34*  GFRNONAA 50* 60* 56*  GFRAA 58* >60 >60    LIVER FUNCTION TESTS:  Recent Labs  11/02/16 0717  BILITOT 1.4*  AST 22  ALT 17  ALKPHOS 86  PROT 7.4  ALBUMIN 3.8    Assessment and Plan:  L Vertebral artery angioplasty/stent placement Doing well Headache treated with Fentanyl and orders for Vicodin as needed Plan for discharge in am if stable   Electronically Signed: Lisle Skillman A, PA-C 11/02/2016, 4:01 PM   I spent a total of 25 Minutes at the the patient's bedside AND on the patient's hospital floor or unit, greater than 50% of which was counseling/coordinating care for L VA pta/stenosis

## 2016-11-02 NOTE — Anesthesia Procedure Notes (Signed)
Procedure Name: MAC Date/Time: 11/02/2016 9:25 AM Performed by: Izora Gala Pre-anesthesia Checklist: Patient identified, Emergency Drugs available, Suction available and Patient being monitored Oxygen Delivery Method: Nasal cannula Preoxygenation: Pre-oxygenation with 100% oxygen Placement Confirmation: positive ETCO2

## 2016-11-02 NOTE — Transfer of Care (Signed)
Immediate Anesthesia Transfer of Care Note  Patient: Michael Hoffman  Procedure(s) Performed: Procedure(s): RADIOLOGY WITH ANESTHESIA/LEFT VERTIBRAL ARTERY STENTING (Left)  Patient Location: PACU  Anesthesia Type:MAC  Level of Consciousness: awake, alert , oriented and patient cooperative  Airway & Oxygen Therapy: Patient Spontanous Breathing and Patient connected to nasal cannula oxygen  Post-op Assessment: Report given to RN, Post -op Vital signs reviewed and stable, Patient moving all extremities and Patient moving all extremities X 4  Post vital signs: Reviewed and stable  Last Vitals:  Vitals:   11/02/16 0752  BP: 98/60  Pulse: (!) 47  Resp: 18  Temp: 36.4 C    Last Pain:  Vitals:   11/02/16 0752  TempSrc: Oral      Patients Stated Pain Goal: 8 (42/87/68 1157)  Complications: No apparent anesthesia complications

## 2016-11-02 NOTE — Procedures (Signed)
S/P Lt VA origin stent assisted angioplasty for symptomatic severe stenosis.

## 2016-11-02 NOTE — Progress Notes (Addendum)
BP 98/60 NIMODIPINE HELD.

## 2016-11-02 NOTE — Progress Notes (Signed)
Pt taken to PACU by RN and CRNA, report given, groin level 0, pedal pulses 1t bilat. Pt answers questions appropriately and correctly. MAE. IR team signing off.

## 2016-11-03 ENCOUNTER — Encounter (HOSPITAL_COMMUNITY): Payer: Self-pay | Admitting: Interventional Radiology

## 2016-11-03 DIAGNOSIS — I6502 Occlusion and stenosis of left vertebral artery: Secondary | ICD-10-CM | POA: Diagnosis not present

## 2016-11-03 LAB — GLUCOSE, CAPILLARY
Glucose-Capillary: 141 mg/dL — ABNORMAL HIGH (ref 65–99)
Glucose-Capillary: 123 mg/dL — ABNORMAL HIGH (ref 65–99)

## 2016-11-03 LAB — BASIC METABOLIC PANEL
Anion gap: 7 (ref 5–15)
BUN: 18 mg/dL (ref 6–20)
CHLORIDE: 104 mmol/L (ref 101–111)
CO2: 24 mmol/L (ref 22–32)
Calcium: 8.3 mg/dL — ABNORMAL LOW (ref 8.9–10.3)
Creatinine, Ser: 1.2 mg/dL (ref 0.61–1.24)
GFR calc non Af Amer: >60 mL/min (ref >60)
GLUCOSE: 158 mg/dL — AB (ref 65–99)
Potassium: 3.6 mmol/L (ref 3.5–5.1)
Sodium: 135 mmol/L (ref 135–145)

## 2016-11-03 LAB — CBC WITH DIFFERENTIAL/PLATELET
Basophils Absolute: 0 10*3/uL (ref 0.0–0.1)
Basophils Relative: 0 %
EOS ABS: 0.1 10*3/uL (ref 0.0–0.7)
Eosinophils Relative: 1 %
HEMATOCRIT: 39.5 % (ref 39.0–52.0)
HEMOGLOBIN: 13.6 g/dL (ref 13.0–17.0)
LYMPHS ABS: 2.9 10*3/uL (ref 0.7–4.0)
Lymphocytes Relative: 24 %
MCH: 30.6 pg (ref 26.0–34.0)
MCHC: 34.4 g/dL (ref 30.0–36.0)
MCV: 89 fL (ref 78.0–100.0)
MONO ABS: 0.7 10*3/uL (ref 0.1–1.0)
MONOS PCT: 6 %
NEUTROS PCT: 69 %
Neutro Abs: 8.1 10*3/uL — ABNORMAL HIGH (ref 1.7–7.7)
Platelets: 290 10*3/uL (ref 150–400)
RBC: 4.44 MIL/uL (ref 4.22–5.81)
RDW: 12 % (ref 11.5–15.5)
WBC: 11.8 10*3/uL — ABNORMAL HIGH (ref 4.0–10.5)

## 2016-11-03 LAB — PLATELET INHIBITION P2Y12: Platelet Function  P2Y12: 87 [PRU] — ABNORMAL LOW (ref 194–418)

## 2016-11-03 NOTE — Progress Notes (Signed)
Patient walked around the unit. Patient tolerated activity well.

## 2016-11-03 NOTE — Progress Notes (Signed)
Patient left unit in a wheel chair.

## 2016-11-03 NOTE — Progress Notes (Signed)
Patient is being d/c home. Dc instruction given to patient and wife. Both verbalized understanding.

## 2016-11-03 NOTE — Discharge Summary (Signed)
Patient ID: Michael Hoffman MRN: [id number] DOB/AGE: 60/08/1955 60 y.o.  Admit date: 11/02/2016 Discharge date: 11/03/2016  Supervising Physician: Julieanne Cottoneveshwar, Sanjeev  [facility]  Admission Diagnoses: Left vertebral artery stenosis  Discharge Diagnoses:  Active Problems:   VBI (vertebrobasilar insufficiency)   Discharged Condition: stable  Hospital Course: Hx CVA 03/2016 and 10 yr ago Was referred to Dr Corliss Skainseveshwar with imbalance issues. Slow speech and minimal confusion per pt and wife. Cerebral arteriogram 09/2016 revealed L Vertebral artery stenosis. Left vertebral artery angioplasty/stent was performed in IR 11/02/16. Pt tolerated well; procedure was without complications Admitted overnight into Neuro ICU- without event. Slept well; eating well; passing gas; urinating on own. Denies N/V; denies pain; speech or visual issues. Did have frontal headache last pm; resolved with minimal medication. Dr Corliss Skainseveshwar has examined pt. Now planned for discharge to home. Pt and wife have good understanding of DC instructions. Plan for 2 week follow up with Dr Corliss Skainseveshwar  Consults: None  Significant Diagnostic Studies: Cerebral arteriogram  Treatments: S/P Lt VA origin stent assisted angioplasty for symptomatic severe stenosis.  Discharge Exam: Blood pressure 113/64, pulse (!) 55, temperature 98.5 F (36.9 C), resp. rate 12, height 5\' 6"  (1.676 m), weight 192 lb 7.4 oz (87.3 kg), SpO2 93 %.  PE: A/O Pleasant Answers all questions appropriately Can seem slightly "forgetful"; answers are slow (wife says this is wnl since first stroke 10 yrs ago) Face symmetrical Tongue midline Finger to nose =B  Heart: RRR Lungs CTA Abd soft +BS; NT Extr: moves all 4s = strength and sensation Rt groin NT no bleeding; no hematoma Rt foot 2+ pulses UOP great; yellow  Results for orders placed or performed during the hospital encounter of 11/02/16  MRSA PCR Screening    Result Value Ref Range   MRSA by PCR NEGATIVE NEGATIVE  Glucose, capillary  Result Value Ref Range   Glucose-Capillary 105 (H) 65 - 99 mg/dL  Glucose, capillary  Result Value Ref Range   Glucose-Capillary 66 65 - 99 mg/dL   Comment 1 Notify RN    Comment 2 Document in Chart   Glucose, capillary  Result Value Ref Range   Glucose-Capillary 140 (H) 65 - 99 mg/dL  Heparin level (unfractionated)  Result Value Ref Range   Heparin Unfractionated <0.10 (L) 0.30 - 0.70 IU/mL  Glucose, capillary  Result Value Ref Range   Glucose-Capillary 101 (H) 65 - 99 mg/dL   Comment 1 Notify RN    Comment 2 Document in Chart   Glucose, capillary  Result Value Ref Range   Glucose-Capillary 108 (H) 65 - 99 mg/dL  Basic metabolic panel  Result Value Ref Range   Sodium 135 135 - 145 mmol/L   Potassium 3.6 3.5 - 5.1 mmol/L   Chloride 104 101 - 111 mmol/L   CO2 24 22 - 32 mmol/L   Glucose, Bld 158 (H) 65 - 99 mg/dL   BUN 18 6 - 20 mg/dL   Creatinine, Ser 6.571.20 0.61 - 1.24 mg/dL   Calcium 8.3 (L) 8.9 - 10.3 mg/dL   GFR calc non Af Amer >60 >60 mL/min   GFR calc Af Amer >60 >60 mL/min   Anion gap 7 5 - 15  CBC WITH DIFFERENTIAL  Result Value Ref Range   WBC 11.8 (H) 4.0 - 10.5 K/uL   RBC 4.44 4.22 - 5.81 MIL/uL   Hemoglobin 13.6 13.0 - 17.0 g/dL   HCT 84.639.5 96.239.0 - 95.252.0 %   MCV 89.0  78.0 - 100.0 fL   MCH 30.6 26.0 - 34.0 pg   MCHC 34.4 30.0 - 36.0 g/dL   RDW 16.1 09.6 - 04.5 %   Platelets 290 150 - 400 K/uL   Neutrophils Relative % 69 %   Neutro Abs 8.1 (H) 1.7 - 7.7 K/uL   Lymphocytes Relative 24 %   Lymphs Abs 2.9 0.7 - 4.0 K/uL   Monocytes Relative 6 %   Monocytes Absolute 0.7 0.1 - 1.0 K/uL   Eosinophils Relative 1 %   Eosinophils Absolute 0.1 0.0 - 0.7 K/uL   Basophils Relative 0 %   Basophils Absolute 0.0 0.0 - 0.1 K/uL  Glucose, capillary  Result Value Ref Range   Glucose-Capillary 154 (H) 65 - 99 mg/dL  Glucose, capillary  Result Value Ref Range   Glucose-Capillary 174 (H) 65 -  99 mg/dL  Glucose, capillary  Result Value Ref Range   Glucose-Capillary 141 (H) 65 - 99 mg/dL   Comment 1 Notify RN    Comment 2 Document in Chart   Platelet inhibition p2y12 (Not at [facility])  Result Value Ref Range   Platelet Function  P2Y12 87 (L) 194 - 418 PRU     Disposition: discharge home today L Vert artery angioplasty/stent 11/02/16 in IR Dr Corliss Skains has seen and evaluated pt. Resume all home meds Continue ASA/Plavix daily 2 week follow up appt with Dr Corliss Skains Pt will hear from scheduler for time and date Pt and wife have good understanding of plan  Discharge Instructions    Call MD for:  difficulty breathing, headache or visual disturbances    Complete by:  As directed    Call MD for:  extreme fatigue    Complete by:  As directed    Call MD for:  hives    Complete by:  As directed    Call MD for:  persistant dizziness or light-headedness    Complete by:  As directed    Call MD for:  persistant nausea and vomiting    Complete by:  As directed    Call MD for:  redness, tenderness, or signs of infection (pain, swelling, redness, odor or green/yellow discharge around incision site)    Complete by:  As directed    Call MD for:  severe uncontrolled pain    Complete by:  As directed    Call MD for:  temperature >100.4    Complete by:  As directed    Diet - low sodium heart healthy    Complete by:  As directed    Discharge instructions    Complete by:  As directed    Resume all home meds; 2 week follow up with Dr Corliss Skains; pt will hear from scheduler for time and date; call [phone] with questions or concerns   Discharge wound care:    Complete by:  As directed    May shower tomorrow; keep fresh bandaid on rt groin daily x 2 weeks   Driving Restrictions    Complete by:  As directed    No driving x 2 weeks   Increase activity slowly    Complete by:  As directed    Lifting restrictions    Complete by:  As directed    No lifting over 10 lbs x 2 weeks      Allergies as of 11/03/2016      Reactions   No Known Allergies       Medication List    TAKE these medications  amLODipine 10 MG tablet Commonly known as:  NORVASC Take 10 mg by mouth daily.   aspirin EC 81 MG tablet Take 81 mg by mouth daily.   carvedilol 25 MG tablet Commonly known as:  COREG Take 25 mg by mouth 2 (two) times daily with a meal.   citalopram 10 MG tablet Commonly known as:  CELEXA Take 10 mg by mouth daily.   cloNIDine 0.1 MG tablet Commonly known as:  CATAPRES Take 0.1 mg by mouth 2 (two) times daily.   clopidogrel 75 MG tablet Commonly known as:  PLAVIX Take 75 mg by mouth daily.   donepezil 10 MG tablet Commonly known as:  ARICEPT Take 10 mg by mouth at bedtime.   insulin glargine 100 UNIT/ML injection Commonly known as:  LANTUS Inject 10 Units into the skin at bedtime.   lisinopril 40 MG tablet Commonly known as:  PRINIVIL,ZESTRIL Take 40 mg by mouth daily.   metFORMIN 1000 MG tablet Commonly known as:  GLUCOPHAGE Take 500 mg by mouth 2 (two) times daily with a meal.   simvastatin 20 MG tablet Commonly known as:  ZOCOR Take 20 mg by mouth daily.      Follow-up Information    Julieanne Cotton, MD Follow up in 2 week(s).   Specialty:  Interventional Radiology Why:  2 week follow up with Dr Corliss Skains; he will hear fom scheduler with time and date; call [phone] if questions or concerns Contact information: [address] [place] [address] [phone]            Electronically Signed: Ralene Muskrat A, PA-C 11/03/2016, 11:15 AM   I have spent Greater Than 30 Minutes discharging Andrew R Halvorson.

## 2016-11-03 NOTE — Progress Notes (Signed)
Patient up in the chair

## 2016-11-04 ENCOUNTER — Telehealth (HOSPITAL_COMMUNITY): Payer: Self-pay

## 2016-11-04 NOTE — Telephone Encounter (Signed)
Called to schedule 2 wk f/u, left message for pt to return call. AW 

## 2016-11-12 ENCOUNTER — Encounter (HOSPITAL_COMMUNITY): Payer: Self-pay | Admitting: Interventional Radiology

## 2016-11-16 ENCOUNTER — Ambulatory Visit (HOSPITAL_COMMUNITY)
Admission: RE | Admit: 2016-11-16 | Discharge: 2016-11-16 | Disposition: A | Discharge status: Home / Self Care | Payer: Commercial Managed Care - PPO | Source: Ambulatory Visit | Attending: Radiology | Admitting: Radiology

## 2016-11-16 ENCOUNTER — Other Ambulatory Visit (HOSPITAL_COMMUNITY): Payer: Self-pay | Admitting: Radiology

## 2016-11-16 DIAGNOSIS — Z48812 Encounter for surgical aftercare following surgery on the circulatory system: Secondary | ICD-10-CM | POA: Insufficient documentation

## 2016-11-16 DIAGNOSIS — R42 Dizziness and giddiness: Secondary | ICD-10-CM | POA: Diagnosis not present

## 2016-11-16 DIAGNOSIS — Z7982 Long term (current) use of aspirin: Secondary | ICD-10-CM | POA: Diagnosis not present

## 2016-11-16 DIAGNOSIS — Z7902 Long term (current) use of antithrombotics/antiplatelets: Secondary | ICD-10-CM | POA: Insufficient documentation

## 2016-11-16 DIAGNOSIS — H43393 Other vitreous opacities, bilateral: Secondary | ICD-10-CM | POA: Diagnosis not present

## 2016-11-16 DIAGNOSIS — I63019 Cerebral infarction due to thrombosis of unspecified vertebral artery: Secondary | ICD-10-CM

## 2016-11-16 DIAGNOSIS — G450 Vertebro-basilar artery syndrome: Secondary | ICD-10-CM

## 2016-11-16 DIAGNOSIS — I779 Disorder of arteries and arterioles, unspecified: Secondary | ICD-10-CM

## 2016-11-16 DIAGNOSIS — I739 Peripheral vascular disease, unspecified: Secondary | ICD-10-CM

## 2016-11-16 LAB — PLATELET INHIBITION P2Y12: PLATELET FUNCTION P2Y12: 26 [PRU] — AB (ref 194–418)

## 2016-11-17 ENCOUNTER — Encounter (HOSPITAL_COMMUNITY): Payer: Self-pay | Admitting: Interventional Radiology

## 2016-11-17 HISTORY — PX: IR RADIOLOGIST EVAL & MGMT: IMG5224

## 2016-11-26 ENCOUNTER — Telehealth (HOSPITAL_COMMUNITY): Payer: Self-pay | Admitting: *Deleted

## 2016-11-26 NOTE — Telephone Encounter (Signed)
Called and left message with patients wife. Per Dr. Corliss Skainseveshwar,  Pt is to continue taking ASA 81mg  a day and Plavix 75mg  daily.  Left call back number if questions or concerns.

## 2017-03-22 ENCOUNTER — Other Ambulatory Visit (HOSPITAL_COMMUNITY): Payer: Self-pay | Admitting: Interventional Radiology

## 2017-03-22 ENCOUNTER — Telehealth (HOSPITAL_COMMUNITY): Payer: Self-pay

## 2017-03-22 NOTE — Telephone Encounter (Signed)
Called to schedule 4 month f/u us carotid, left message for pt's wife to return call. AW

## 2017-03-24 ENCOUNTER — Other Ambulatory Visit (HOSPITAL_COMMUNITY): Payer: Self-pay | Admitting: Interventional Radiology

## 2017-03-24 DIAGNOSIS — I771 Stricture of artery: Secondary | ICD-10-CM

## 2017-03-24 DIAGNOSIS — I639 Cerebral infarction, unspecified: Secondary | ICD-10-CM

## 2017-04-20 ENCOUNTER — Encounter (HOSPITAL_COMMUNITY): Payer: Self-pay

## 2017-04-20 ENCOUNTER — Ambulatory Visit (HOSPITAL_COMMUNITY): Payer: Commercial Managed Care - PPO

## 2019-03-16 IMAGING — XA IR ANGIO INTRA EXTRACRAN SEL COM CAROTID INNOMINATE BILAT MOD SE
1 series · 11 of 24 positions shown · IV contrast (IODINE)
Comparison: none

INDICATION: Vertebrobasilar ischemic symptoms.  Gait imbalance.  Dizziness.
TECHNIQUE: Informed written consent was obtained from the patient after a
thorough discussion of the procedural risks, benefits and
alternatives. All questions were addressed. Maximal Sterile Barrier
Technique was utilized including caps, mask, sterile gowns, sterile
gloves, sterile drape, hand hygiene and skin antiseptic. A timeout
was performed prior to the initiation of the procedure.

[Series 300: dr. (person_name) · 11 of 208 slices shown]
[im 10/208]
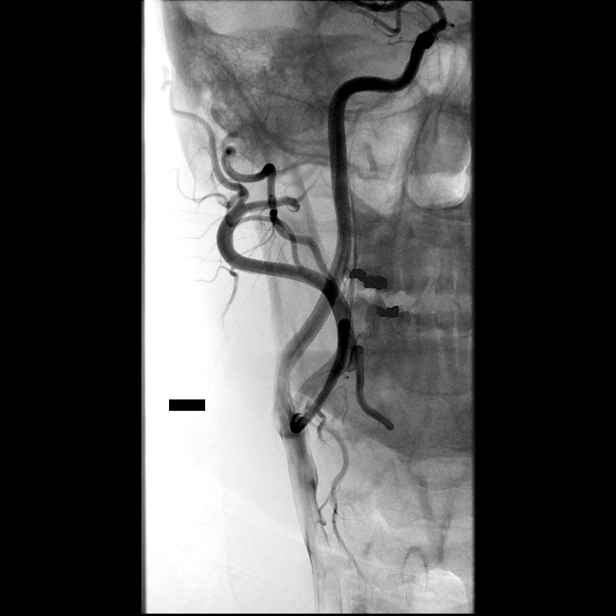
[im 28/208]
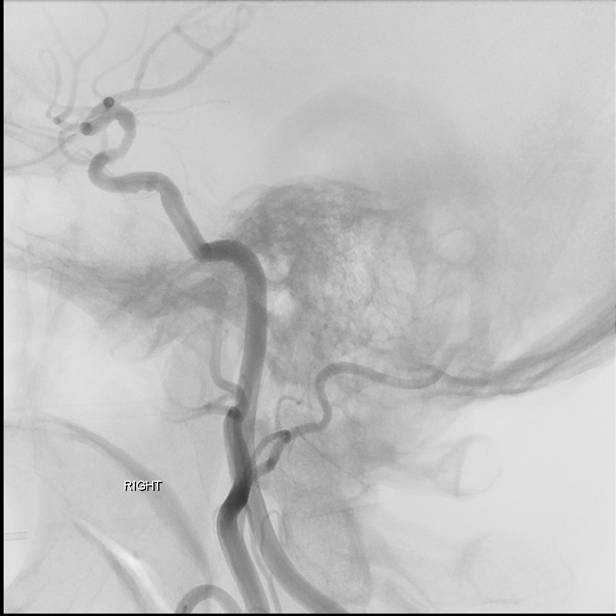
[im 46/208]
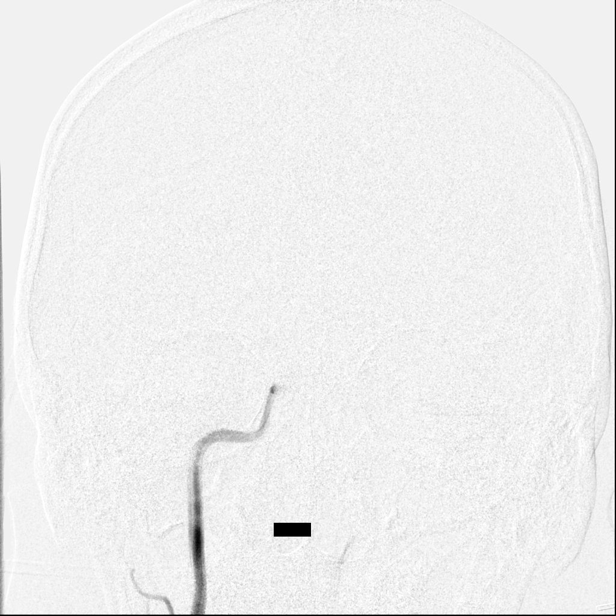
[im 64/208]
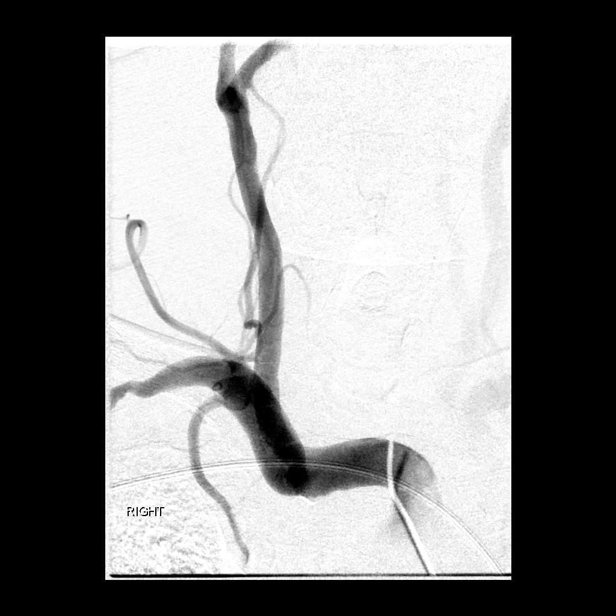
[im 82/208]
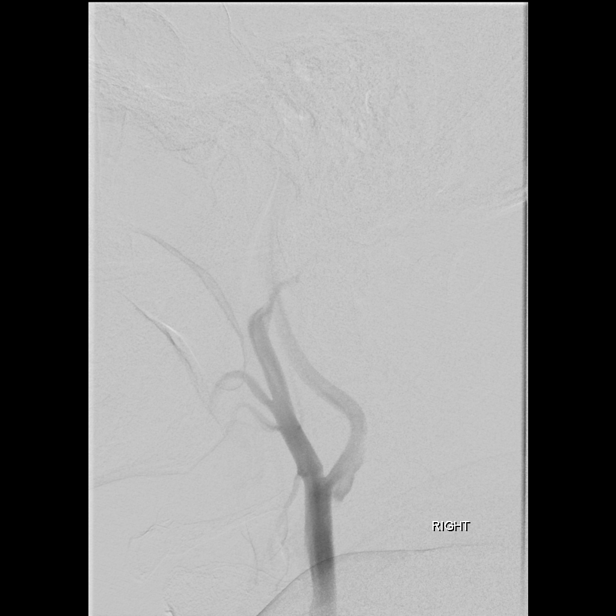
[im 109/208]
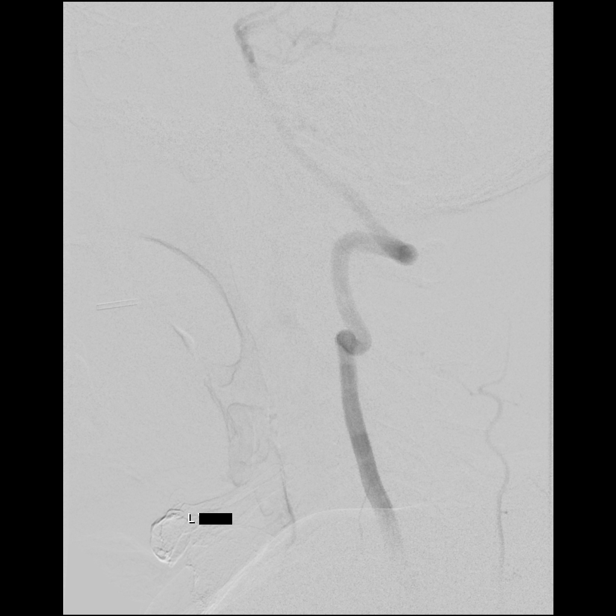
[im 127/208]
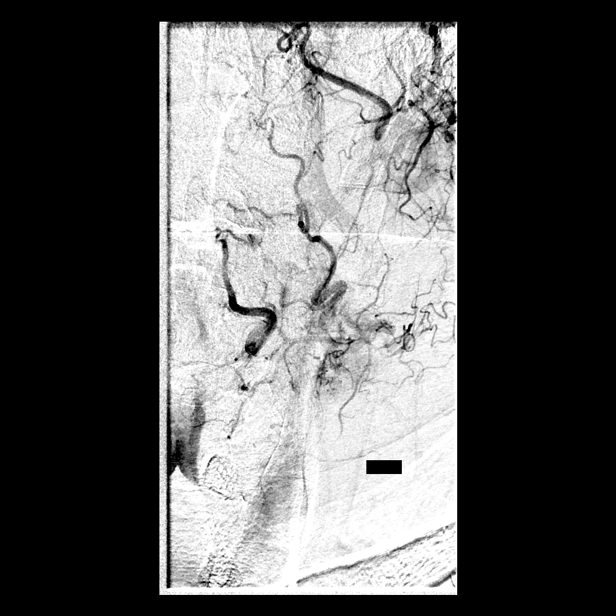
[im 145/208]
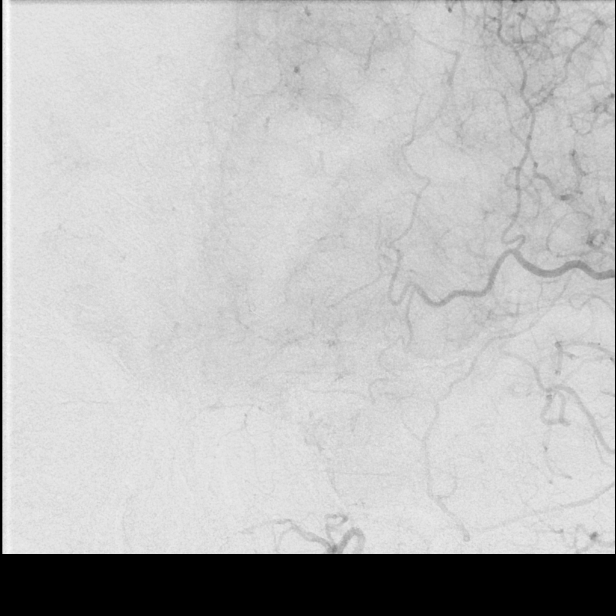
[im 163/208]
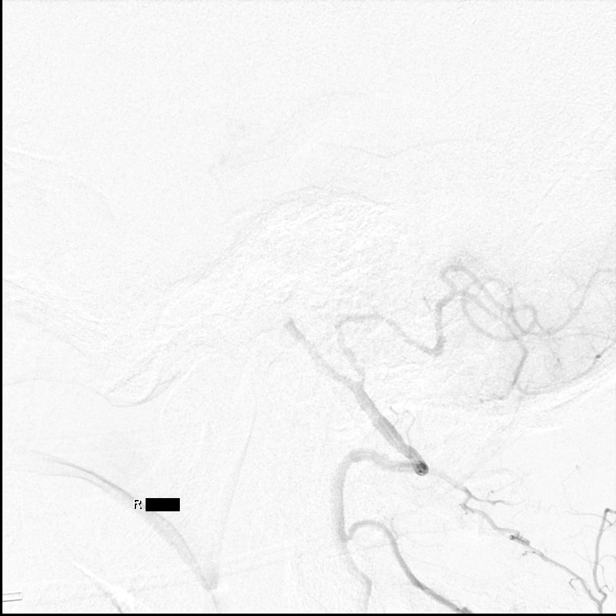
[im 181/208]
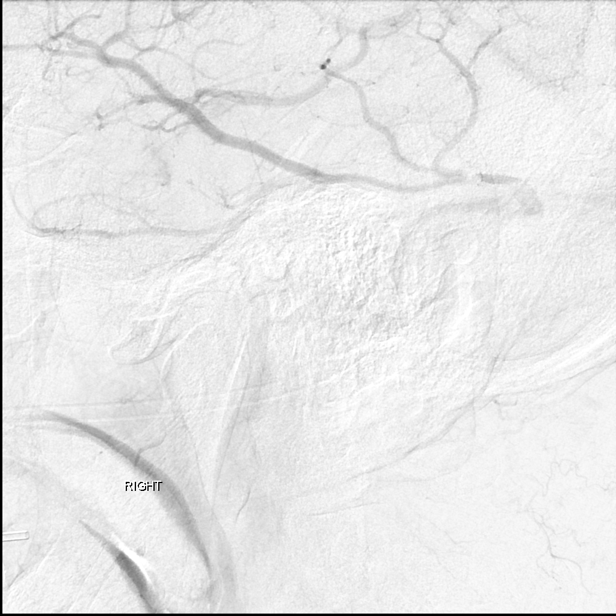
[im 199/208]
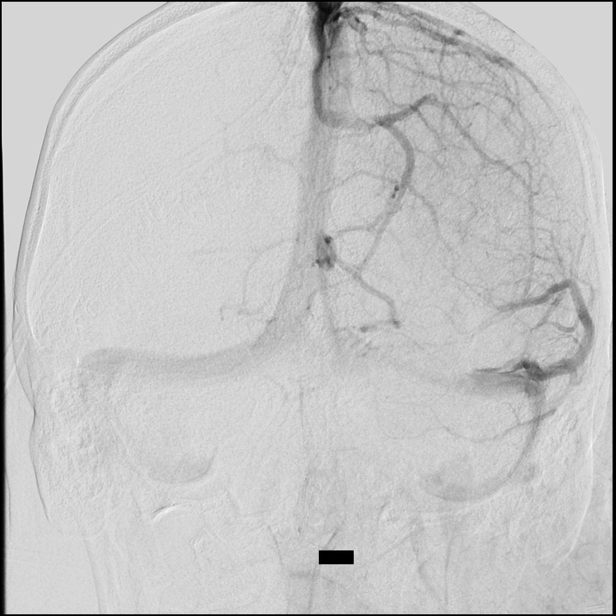

[11 of 24 positions shown; findings below may reference images not displayed]

EXAM:
BILATERAL COMMON CAROTID AND INNOMINATE ANGIOGRAPHY AND BILATERAL
VERTEBRAL ARTERY ANGIOGRAMS

MEDICATIONS:
Heparin 0333 units IV.. No antibiotic was administered within 1 hour
of the procedure.

ANESTHESIA/SEDATION:
Versed 1 mg IV; Fentanyl 25 mcg IV.

Moderate Sedation Time:  30 minutes.

The patient was continuously monitored during the procedure by the
interventional radiology nurse under my direct supervision.

FLUOROSCOPY TIME:  Fluoroscopy Time: 8 minutes 24 seconds (167 mGy).

COMPLICATIONS:
None immediate.
PROCEDURE:
The right groin was prepped and draped in the usual sterile fashion.
Thereafter using modified Seldinger technique, transfemoral access
into the right common femoral artery was obtained without
difficulty. Over a 0.035 inch guidewire, a 5 French Pinnacle sheath
was inserted. Through this, and also over 0.035 inch guidewire, a 5
French JB1 catheter was advanced to the aortic arch region and
selectively positioned in the right common carotid artery, the right
subclavian artery, the left common carotid artery and the left
subclavian artery.
FINDINGS: The right common carotid arteriogram demonstrates a smooth segmental
atherosclerotic plaque involving the medial aspect of the middle [DATE]
of the right common carotid artery.

The right external carotid artery and its major branches are
normally opacified.

The right internal carotid artery at the bulb demonstrates mild
atherosclerotic irregularity along its posterior wall without
significant stenoses.

No intraluminal filling defects are seen.

The vessel is, otherwise, seen to opacify normally to the cranial
skull base. The petrous and the cavernous segments are widely
patent.

There is approximately 50% stenosis of the right internal carotid
artery supraclinoid segment. The right middle cerebellar artery and
the right anterior cerebral artery opacify into the capillary and
venous phases.

Focal areas of mild caliber irregularity and narrowing are noted
involving the inferior division of the right middle cerebral artery
and the distal right M1 segment, with a 50% stenosis of the right
middle cerebral artery.

The right subclavian arteriogram demonstrates angiographic occlusion
of the right vertebral artery.

There is reconstitution of the right vertebral artery at the level
of C2 from the branches of the ascending cervical branch of the
right thyrocervical trunk.

Opacification is noted into the right vertebrobasilar junction and
the right posterior-inferior cerebellar artery. Both these
demonstrate focal areas of caliber irregularity with narrowing
consistent with intracranial arteriosclerosis.

Flow is noted into the distal right vertebrobasilar junction and
subsequently opacification with mixing of non-opacified blood in the
basilar artery.

Partial retrograde opacification of the mid right vertebral artery
is noted.

Also demonstrated is a 75% stenosis of the right subclavian artery
just distal to the origin of the thyrocervical trunk.

The left common carotid arteriogram demonstrates the left external
carotid artery and its major branches to be widely patent.

Focal signal and shelf-like plaque is noted involving the distal
left common carotid artery.

The left external carotid artery and its major branches are widely
patent.

The left internal carotid artery at the bulb has a focal shelf-like
plaque along its inferior aspect without significant stenosis by the
NASCET criteria.

The left internal carotid artery is seen to opacify to the cranial
skull base.

The petrous segment is widely patent.

There is mild stenosis of the distal cavernous segment of the left
internal carotid artery.

The supraclinoid segment is widely patent.

The left middle cerebral artery and the left anterior cerebral
artery are seen to opacify into the capillary and venous phases.

Focal areas of caliber irregularity and narrowing are seen of the
anterior temporal branch and the superior and inferior M2 branches
of the left middle cerebral artery.

The dominant left vertebral artery origin demonstrates a 90% plus
stenoses with mild poststenotic dilatation.

Antegrade flow is noted distally to the left vertebrobasilar
junction. No definitive left posterior-inferior cerebellar artery is
identified. However, there is a suggestion of a left
anterior-inferior cerebellar artery/posterior inferior cerebellar
artery complex. The basilar artery, the proximal posterior cerebral
artery, the superior cerebellar arteries and the anterior-inferior
cerebellar are seen to opacify into the capillary and venous phases.
Focal areas of mild-to-moderate caliber irregularity are seen
involving the right posterior cerebral artery P1 segment and to a
less degree the left P1 segment.
IMPRESSION: 90% plus stenoses of the dominant left vertebral artery at its
origin with mild poststenotic dilatation.

Angiographic occlusion of the right vertebral artery with distal
reconstitution from musculoskeletal branches of the ascending
cervical branch of the right thyrocervical trunk.

Approximately 75% stenosis of the right subclavian artery distal to
the origin of the thyrocervical trunk.

Approximately 50% stenosis of the right internal carotid artery
supraclinoid segment.

Moderate intracranial arteriosclerotic changes involving the middle
cerebral artery distributions, and the right vertebrobasilar and the
PICA distributions, and also of the left posterior cerebral
arteries.

Angiographic findings were reviewed with the patient and the
patient's spouse.

## 2022-02-16 ENCOUNTER — Other Ambulatory Visit (HOSPITAL_COMMUNITY): Payer: Self-pay

## 2022-02-17 ENCOUNTER — Other Ambulatory Visit (HOSPITAL_COMMUNITY): Payer: Self-pay

## 2022-02-17 MED ORDER — SIMVASTATIN 20 MG PO TABS
20.0000 mg | ORAL_TABLET | Freq: Every evening | ORAL | 3 refills | Status: DC
  Filled 2022-02-17: qty 30, 30d supply, fill #0
  Filled 2022-03-17: qty 30, 30d supply, fill #1
  Filled 2022-04-13: qty 30, 30d supply, fill #2
  Filled 2022-05-13: qty 30, 30d supply, fill #3
  Filled 2022-06-11: qty 30, 30d supply, fill #4
  Filled 2022-07-11: qty 30, 30d supply, fill #5
  Filled 2022-08-07: qty 30, 30d supply, fill #6
  Filled 2022-09-05: qty 90, 90d supply, fill #7
  Filled 2022-11-22: qty 90, 90d supply, fill #8
  Filled 2022-12-01: qty 30, 30d supply, fill #8
  Filled 2023-01-02: qty 30, 30d supply, fill #9

## 2022-02-17 MED ORDER — CARVEDILOL 12.5 MG PO TABS
12.5000 mg | ORAL_TABLET | Freq: Two times a day (BID) | ORAL | 0 refills | Status: DC
  Filled 2022-02-17: qty 60, 30d supply, fill #0
  Filled 2022-03-18: qty 120, 60d supply, fill #1

## 2022-02-17 MED ORDER — JARDIANCE 10 MG PO TABS
10.0000 mg | ORAL_TABLET | Freq: Every day | ORAL | 11 refills | Status: AC
  Filled 2022-02-17: qty 90, 90d supply, fill #0
  Filled 2022-05-23: qty 90, 90d supply, fill #1
  Filled 2022-08-21: qty 30, 30d supply, fill #2
  Filled 2022-09-22: qty 30, 30d supply, fill #3
  Filled 2022-10-22: qty 30, 30d supply, fill #4

## 2022-02-18 ENCOUNTER — Other Ambulatory Visit (HOSPITAL_COMMUNITY): Payer: Self-pay

## 2022-02-23 ENCOUNTER — Other Ambulatory Visit (HOSPITAL_COMMUNITY): Payer: Self-pay

## 2022-02-26 ENCOUNTER — Other Ambulatory Visit (HOSPITAL_COMMUNITY): Payer: Self-pay

## 2022-02-27 ENCOUNTER — Other Ambulatory Visit (HOSPITAL_COMMUNITY): Payer: Self-pay

## 2022-03-02 ENCOUNTER — Other Ambulatory Visit (HOSPITAL_COMMUNITY): Payer: Self-pay

## 2022-03-02 MED ORDER — INSULIN GLARGINE 100 UNIT/ML ~~LOC~~ SOLN
SUBCUTANEOUS | 11 refills | Status: DC
  Filled 2022-03-02: qty 40, 80d supply, fill #0
  Filled 2022-05-13: qty 40, 80d supply, fill #1
  Filled 2022-08-21: qty 40, 80d supply, fill #2
  Filled 2022-12-01: qty 40, 80d supply, fill #3

## 2022-03-02 MED ORDER — CLOPIDOGREL BISULFATE 75 MG PO TABS
75.0000 mg | ORAL_TABLET | Freq: Every day | ORAL | 3 refills | Status: DC
  Filled 2022-03-02: qty 90, 90d supply, fill #0
  Filled 2022-06-11: qty 90, 90d supply, fill #1
  Filled 2022-09-05: qty 90, 90d supply, fill #2
  Filled 2022-11-22 – 2022-12-01 (×2): qty 90, 90d supply, fill #3

## 2022-03-03 ENCOUNTER — Other Ambulatory Visit (HOSPITAL_COMMUNITY): Payer: Self-pay

## 2022-03-18 ENCOUNTER — Other Ambulatory Visit (HOSPITAL_COMMUNITY): Payer: Self-pay

## 2022-03-19 ENCOUNTER — Other Ambulatory Visit (HOSPITAL_COMMUNITY): Payer: Self-pay

## 2022-04-03 ENCOUNTER — Other Ambulatory Visit (HOSPITAL_COMMUNITY): Payer: Self-pay

## 2022-04-03 MED ORDER — CITALOPRAM HYDROBROMIDE 10 MG PO TABS
10.0000 mg | ORAL_TABLET | Freq: Every day | ORAL | 3 refills | Status: DC
  Filled 2022-04-03: qty 90, 90d supply, fill #0
  Filled 2022-06-30: qty 90, 90d supply, fill #1
  Filled 2022-09-28: qty 90, 90d supply, fill #2
  Filled 2023-01-25: qty 90, 90d supply, fill #3

## 2022-04-03 MED ORDER — AMLODIPINE BESYLATE 10 MG PO TABS
10.0000 mg | ORAL_TABLET | Freq: Every day | ORAL | 3 refills | Status: DC
  Filled 2022-04-03: qty 90, 90d supply, fill #0
  Filled 2022-06-30: qty 90, 90d supply, fill #1
  Filled 2022-09-28: qty 90, 90d supply, fill #2
  Filled 2022-11-15 – 2023-01-02 (×2): qty 90, 90d supply, fill #3

## 2022-04-03 MED ORDER — CLONIDINE HCL 0.1 MG PO TABS
0.1000 mg | ORAL_TABLET | Freq: Two times a day (BID) | ORAL | 3 refills | Status: DC
  Filled 2022-04-03: qty 180, 90d supply, fill #0
  Filled 2022-06-30: qty 180, 90d supply, fill #1
  Filled 2022-09-28: qty 180, 90d supply, fill #2
  Filled 2023-01-25: qty 180, 90d supply, fill #3

## 2022-04-03 MED ORDER — LISINOPRIL 40 MG PO TABS
40.0000 mg | ORAL_TABLET | Freq: Every day | ORAL | 3 refills | Status: DC
  Filled 2022-04-03: qty 90, 90d supply, fill #0
  Filled 2022-06-30: qty 90, 90d supply, fill #1
  Filled 2022-09-28: qty 90, 90d supply, fill #2
  Filled 2023-01-25: qty 90, 90d supply, fill #3

## 2022-04-03 MED ORDER — DONEPEZIL HCL 10 MG PO TABS
10.0000 mg | ORAL_TABLET | Freq: Every day | ORAL | 3 refills | Status: DC
  Filled 2022-04-03: qty 90, 90d supply, fill #0
  Filled 2022-06-30: qty 90, 90d supply, fill #1
  Filled 2022-09-28: qty 90, 90d supply, fill #2
  Filled 2023-01-25: qty 90, 90d supply, fill #3

## 2022-04-14 ENCOUNTER — Other Ambulatory Visit (HOSPITAL_COMMUNITY): Payer: Self-pay

## 2022-05-13 ENCOUNTER — Other Ambulatory Visit: Payer: Self-pay

## 2022-05-13 ENCOUNTER — Other Ambulatory Visit (HOSPITAL_COMMUNITY): Payer: Self-pay

## 2022-05-18 ENCOUNTER — Other Ambulatory Visit (HOSPITAL_COMMUNITY): Payer: Self-pay

## 2022-05-19 ENCOUNTER — Other Ambulatory Visit: Payer: Self-pay

## 2022-05-19 ENCOUNTER — Other Ambulatory Visit (HOSPITAL_COMMUNITY): Payer: Self-pay

## 2022-05-19 MED ORDER — CARVEDILOL 12.5 MG PO TABS
12.5000 mg | ORAL_TABLET | Freq: Two times a day (BID) | ORAL | 1 refills | Status: DC
  Filled 2022-05-19: qty 180, 90d supply, fill #0
  Filled 2022-08-21: qty 180, 90d supply, fill #1

## 2022-05-25 ENCOUNTER — Other Ambulatory Visit: Payer: Self-pay

## 2022-06-12 ENCOUNTER — Other Ambulatory Visit (HOSPITAL_COMMUNITY): Payer: Self-pay

## 2022-07-01 ENCOUNTER — Other Ambulatory Visit: Payer: Self-pay

## 2022-07-03 ENCOUNTER — Other Ambulatory Visit (HOSPITAL_COMMUNITY): Payer: Self-pay

## 2022-07-03 ENCOUNTER — Other Ambulatory Visit: Payer: Self-pay

## 2022-07-03 MED ORDER — GVOKE HYPOPEN 2-PACK 1 MG/0.2ML ~~LOC~~ SOAJ
1.0000 mg | SUBCUTANEOUS | 11 refills | Status: AC | PRN
  Filled 2022-07-03: qty 0.4, 30d supply, fill #0

## 2022-07-11 ENCOUNTER — Other Ambulatory Visit (HOSPITAL_COMMUNITY): Payer: Self-pay

## 2022-08-07 ENCOUNTER — Other Ambulatory Visit: Payer: Self-pay

## 2022-08-21 ENCOUNTER — Other Ambulatory Visit: Payer: Self-pay

## 2022-08-21 ENCOUNTER — Other Ambulatory Visit (HOSPITAL_COMMUNITY): Payer: Self-pay

## 2022-08-22 ENCOUNTER — Other Ambulatory Visit (HOSPITAL_COMMUNITY): Payer: Self-pay

## 2022-08-24 ENCOUNTER — Other Ambulatory Visit (HOSPITAL_COMMUNITY): Payer: Self-pay

## 2022-08-24 ENCOUNTER — Other Ambulatory Visit: Payer: Self-pay

## 2022-08-28 ENCOUNTER — Other Ambulatory Visit: Payer: Self-pay

## 2022-09-05 ENCOUNTER — Other Ambulatory Visit (HOSPITAL_COMMUNITY): Payer: Self-pay

## 2022-09-23 ENCOUNTER — Other Ambulatory Visit (HOSPITAL_COMMUNITY): Payer: Self-pay

## 2022-09-29 ENCOUNTER — Other Ambulatory Visit: Payer: Self-pay

## 2022-10-23 ENCOUNTER — Other Ambulatory Visit: Payer: Self-pay

## 2022-10-23 ENCOUNTER — Other Ambulatory Visit (HOSPITAL_COMMUNITY): Payer: Self-pay

## 2022-10-23 MED ORDER — HUMALOG KWIKPEN 200 UNIT/ML ~~LOC~~ SOPN
2.0000 [IU] | PEN_INJECTOR | SUBCUTANEOUS | 11 refills | Status: DC
  Filled 2022-10-23: qty 6, 27d supply, fill #0
  Filled 2023-01-17: qty 6, 40d supply, fill #1
  Filled 2023-03-25: qty 6, 40d supply, fill #2
  Filled 2023-05-05: qty 6, 40d supply, fill #3
  Filled 2023-07-12: qty 6, 40d supply, fill #4
  Filled 2023-10-09: qty 6, 40d supply, fill #5

## 2022-10-26 ENCOUNTER — Other Ambulatory Visit (HOSPITAL_COMMUNITY): Payer: Self-pay

## 2022-10-26 ENCOUNTER — Other Ambulatory Visit: Payer: Self-pay

## 2022-10-26 ENCOUNTER — Encounter: Payer: Self-pay | Admitting: Pharmacist

## 2022-10-27 ENCOUNTER — Other Ambulatory Visit (HOSPITAL_COMMUNITY): Payer: Self-pay

## 2022-11-15 ENCOUNTER — Other Ambulatory Visit (HOSPITAL_COMMUNITY): Payer: Self-pay

## 2022-11-16 ENCOUNTER — Other Ambulatory Visit (HOSPITAL_COMMUNITY): Payer: Self-pay

## 2022-11-16 ENCOUNTER — Other Ambulatory Visit: Payer: Self-pay

## 2022-11-16 MED ORDER — CARVEDILOL 12.5 MG PO TABS
12.5000 mg | ORAL_TABLET | Freq: Two times a day (BID) | ORAL | 1 refills | Status: DC
  Filled 2022-11-16: qty 180, 90d supply, fill #0
  Filled 2023-02-19: qty 180, 90d supply, fill #1

## 2022-11-17 ENCOUNTER — Other Ambulatory Visit: Payer: Self-pay

## 2022-11-23 ENCOUNTER — Other Ambulatory Visit: Payer: Self-pay

## 2022-12-02 ENCOUNTER — Other Ambulatory Visit: Payer: Self-pay

## 2023-01-02 ENCOUNTER — Other Ambulatory Visit (HOSPITAL_COMMUNITY): Payer: Self-pay

## 2023-01-17 ENCOUNTER — Other Ambulatory Visit (HOSPITAL_COMMUNITY): Payer: Self-pay

## 2023-01-25 ENCOUNTER — Other Ambulatory Visit (HOSPITAL_COMMUNITY): Payer: Self-pay

## 2023-01-29 ENCOUNTER — Other Ambulatory Visit: Payer: Self-pay

## 2023-01-29 ENCOUNTER — Other Ambulatory Visit (HOSPITAL_COMMUNITY): Payer: Self-pay

## 2023-01-29 MED ORDER — HYDRALAZINE HCL 10 MG PO TABS
10.0000 mg | ORAL_TABLET | Freq: Two times a day (BID) | ORAL | 6 refills | Status: DC
  Filled 2023-01-29 (×2): qty 60, 30d supply, fill #0
  Filled 2023-03-08: qty 60, 30d supply, fill #1
  Filled 2023-04-03: qty 60, 30d supply, fill #2
  Filled 2023-05-05: qty 60, 30d supply, fill #3
  Filled 2023-06-02: qty 60, 30d supply, fill #4

## 2023-01-29 MED ORDER — CARVEDILOL 6.25 MG PO TABS
6.2500 mg | ORAL_TABLET | Freq: Two times a day (BID) | ORAL | 6 refills | Status: DC
  Filled 2023-01-29 (×2): qty 60, 30d supply, fill #0
  Filled 2023-03-08: qty 60, 30d supply, fill #1
  Filled 2023-04-03: qty 60, 30d supply, fill #2
  Filled 2023-05-05: qty 60, 30d supply, fill #3
  Filled 2023-11-28: qty 60, 30d supply, fill #4
  Filled 2023-12-25: qty 60, 30d supply, fill #5
  Filled 2024-01-25: qty 60, 30d supply, fill #6

## 2023-02-16 ENCOUNTER — Other Ambulatory Visit (HOSPITAL_COMMUNITY): Payer: Self-pay

## 2023-02-18 ENCOUNTER — Other Ambulatory Visit (HOSPITAL_COMMUNITY): Payer: Self-pay

## 2023-02-19 ENCOUNTER — Other Ambulatory Visit (HOSPITAL_COMMUNITY): Payer: Self-pay

## 2023-02-19 ENCOUNTER — Other Ambulatory Visit: Payer: Self-pay

## 2023-02-26 ENCOUNTER — Other Ambulatory Visit: Payer: Self-pay

## 2023-02-26 ENCOUNTER — Other Ambulatory Visit (HOSPITAL_COMMUNITY): Payer: Self-pay

## 2023-02-26 MED ORDER — SIMVASTATIN 20 MG PO TABS
20.0000 mg | ORAL_TABLET | Freq: Every day | ORAL | 3 refills | Status: DC
  Filled 2023-02-26: qty 90, 90d supply, fill #0
  Filled 2023-05-24: qty 90, 90d supply, fill #1
  Filled 2023-08-20: qty 90, 90d supply, fill #2
  Filled 2023-11-10: qty 90, 90d supply, fill #3

## 2023-03-08 ENCOUNTER — Other Ambulatory Visit (HOSPITAL_COMMUNITY): Payer: Self-pay

## 2023-03-08 MED ORDER — CLOPIDOGREL BISULFATE 75 MG PO TABS
75.0000 mg | ORAL_TABLET | Freq: Every day | ORAL | 3 refills | Status: DC
  Filled 2023-03-08: qty 90, 90d supply, fill #0
  Filled 2023-06-02: qty 90, 90d supply, fill #1
  Filled 2023-09-03: qty 90, 90d supply, fill #2
  Filled 2023-11-28: qty 90, 90d supply, fill #3

## 2023-03-09 ENCOUNTER — Other Ambulatory Visit (HOSPITAL_COMMUNITY): Payer: Self-pay

## 2023-03-25 ENCOUNTER — Other Ambulatory Visit (HOSPITAL_COMMUNITY): Payer: Self-pay

## 2023-03-26 ENCOUNTER — Other Ambulatory Visit (HOSPITAL_COMMUNITY): Payer: Self-pay

## 2023-03-26 ENCOUNTER — Other Ambulatory Visit: Payer: Self-pay

## 2023-03-26 MED ORDER — LANTUS 100 UNIT/ML ~~LOC~~ SOLN
50.0000 [IU] | Freq: Every day | SUBCUTANEOUS | 11 refills | Status: DC
  Filled 2023-03-26: qty 50, 100d supply, fill #0
  Filled 2023-08-08: qty 50, 100d supply, fill #1
  Filled 2023-12-25: qty 20, 40d supply, fill #2
  Filled 2024-03-19: qty 20, 40d supply, fill #3

## 2023-04-03 ENCOUNTER — Other Ambulatory Visit (HOSPITAL_COMMUNITY): Payer: Self-pay

## 2023-04-05 ENCOUNTER — Other Ambulatory Visit (HOSPITAL_COMMUNITY): Payer: Self-pay

## 2023-04-05 ENCOUNTER — Other Ambulatory Visit: Payer: Self-pay

## 2023-04-05 MED ORDER — AMLODIPINE BESYLATE 10 MG PO TABS
10.0000 mg | ORAL_TABLET | Freq: Every day | ORAL | 3 refills | Status: DC
  Filled 2023-04-05: qty 90, 90d supply, fill #0
  Filled 2023-07-12: qty 90, 90d supply, fill #1
  Filled 2023-10-09: qty 90, 90d supply, fill #2
  Filled 2024-01-25: qty 90, 90d supply, fill #3

## 2023-04-22 ENCOUNTER — Other Ambulatory Visit (HOSPITAL_COMMUNITY): Payer: Self-pay

## 2023-04-22 MED ORDER — CLONIDINE HCL 0.1 MG PO TABS
0.1000 mg | ORAL_TABLET | Freq: Two times a day (BID) | ORAL | 3 refills | Status: DC
  Filled 2023-04-22: qty 180, 90d supply, fill #0
  Filled 2023-08-22: qty 180, 90d supply, fill #1
  Filled 2023-11-28: qty 180, 90d supply, fill #2
  Filled 2024-02-26: qty 180, 90d supply, fill #3

## 2023-04-22 MED ORDER — CITALOPRAM HYDROBROMIDE 10 MG PO TABS
10.0000 mg | ORAL_TABLET | Freq: Every day | ORAL | 3 refills | Status: DC
  Filled 2023-04-22: qty 90, 90d supply, fill #0
  Filled 2023-08-08: qty 90, 90d supply, fill #1
  Filled 2023-11-04: qty 90, 90d supply, fill #2
  Filled 2024-02-04: qty 90, 90d supply, fill #3

## 2023-04-22 MED ORDER — LISINOPRIL 40 MG PO TABS
40.0000 mg | ORAL_TABLET | Freq: Every day | ORAL | 3 refills | Status: DC
  Filled 2023-04-22: qty 90, 90d supply, fill #0
  Filled 2023-08-08: qty 90, 90d supply, fill #1
  Filled 2023-11-04: qty 90, 90d supply, fill #2
  Filled 2024-02-04: qty 90, 90d supply, fill #3

## 2023-04-22 MED ORDER — DONEPEZIL HCL 10 MG PO TABS
10.0000 mg | ORAL_TABLET | Freq: Every day | ORAL | 3 refills | Status: DC
  Filled 2023-04-22: qty 90, 90d supply, fill #0
  Filled 2023-08-08: qty 90, 90d supply, fill #1
  Filled 2023-11-04: qty 90, 90d supply, fill #2
  Filled 2024-02-04: qty 90, 90d supply, fill #3

## 2023-05-06 ENCOUNTER — Other Ambulatory Visit: Payer: Self-pay

## 2023-05-24 ENCOUNTER — Other Ambulatory Visit: Payer: Self-pay

## 2023-06-03 ENCOUNTER — Other Ambulatory Visit: Payer: Self-pay

## 2023-06-18 ENCOUNTER — Other Ambulatory Visit: Payer: Self-pay

## 2023-07-13 ENCOUNTER — Other Ambulatory Visit (HOSPITAL_COMMUNITY): Payer: Self-pay

## 2023-08-09 ENCOUNTER — Other Ambulatory Visit: Payer: Self-pay

## 2023-08-10 ENCOUNTER — Other Ambulatory Visit: Payer: Self-pay

## 2023-08-20 ENCOUNTER — Other Ambulatory Visit (HOSPITAL_COMMUNITY): Payer: Self-pay

## 2023-08-22 ENCOUNTER — Other Ambulatory Visit (HOSPITAL_COMMUNITY): Payer: Self-pay

## 2023-08-23 ENCOUNTER — Other Ambulatory Visit (HOSPITAL_COMMUNITY): Payer: Self-pay

## 2023-08-24 ENCOUNTER — Other Ambulatory Visit (HOSPITAL_COMMUNITY): Payer: Self-pay

## 2023-08-24 ENCOUNTER — Other Ambulatory Visit: Payer: Self-pay

## 2023-08-24 MED ORDER — CARVEDILOL 12.5 MG PO TABS
12.5000 mg | ORAL_TABLET | Freq: Two times a day (BID) | ORAL | 3 refills | Status: DC
  Filled 2023-08-24: qty 180, 90d supply, fill #0

## 2023-09-03 ENCOUNTER — Other Ambulatory Visit (HOSPITAL_COMMUNITY): Payer: Self-pay

## 2023-10-09 ENCOUNTER — Other Ambulatory Visit (HOSPITAL_COMMUNITY): Payer: Self-pay

## 2023-10-10 ENCOUNTER — Other Ambulatory Visit: Payer: Self-pay

## 2023-11-04 ENCOUNTER — Other Ambulatory Visit (HOSPITAL_COMMUNITY): Payer: Self-pay

## 2023-11-11 ENCOUNTER — Other Ambulatory Visit (HOSPITAL_COMMUNITY): Payer: Self-pay

## 2023-11-28 ENCOUNTER — Other Ambulatory Visit (HOSPITAL_COMMUNITY): Payer: Self-pay

## 2023-11-29 ENCOUNTER — Other Ambulatory Visit: Payer: Self-pay

## 2023-11-29 ENCOUNTER — Other Ambulatory Visit (HOSPITAL_COMMUNITY): Payer: Self-pay

## 2023-11-29 MED ORDER — INSULIN LISPRO 200 UNIT/ML ~~LOC~~ SOPN
2.0000 [IU] | PEN_INJECTOR | SUBCUTANEOUS | 11 refills | Status: AC
  Filled 2023-11-29: qty 9, 60d supply, fill #0
  Filled 2024-02-04: qty 9, 60d supply, fill #1
  Filled 2024-04-19: qty 9, 60d supply, fill #2

## 2023-12-25 ENCOUNTER — Other Ambulatory Visit (HOSPITAL_COMMUNITY): Payer: Self-pay

## 2023-12-27 ENCOUNTER — Other Ambulatory Visit: Payer: Self-pay

## 2023-12-28 ENCOUNTER — Other Ambulatory Visit: Payer: Self-pay

## 2023-12-28 ENCOUNTER — Other Ambulatory Visit (HOSPITAL_COMMUNITY): Payer: Self-pay

## 2024-01-25 ENCOUNTER — Other Ambulatory Visit: Payer: Self-pay

## 2024-02-06 ENCOUNTER — Other Ambulatory Visit (HOSPITAL_COMMUNITY): Payer: Self-pay

## 2024-02-07 ENCOUNTER — Other Ambulatory Visit: Payer: Self-pay

## 2024-02-13 ENCOUNTER — Other Ambulatory Visit (HOSPITAL_COMMUNITY): Payer: Self-pay

## 2024-02-15 ENCOUNTER — Other Ambulatory Visit (HOSPITAL_COMMUNITY): Payer: Self-pay

## 2024-02-15 MED ORDER — SIMVASTATIN 20 MG PO TABS
20.0000 mg | ORAL_TABLET | Freq: Every day | ORAL | 3 refills | Status: AC
  Filled 2024-02-15: qty 90, 90d supply, fill #0
  Filled 2024-05-11: qty 90, 90d supply, fill #1

## 2024-02-24 ENCOUNTER — Other Ambulatory Visit (HOSPITAL_COMMUNITY): Payer: Self-pay

## 2024-02-25 ENCOUNTER — Other Ambulatory Visit (HOSPITAL_COMMUNITY): Payer: Self-pay

## 2024-02-26 ENCOUNTER — Other Ambulatory Visit (HOSPITAL_COMMUNITY): Payer: Self-pay

## 2024-02-27 ENCOUNTER — Other Ambulatory Visit (HOSPITAL_COMMUNITY): Payer: Self-pay

## 2024-02-28 ENCOUNTER — Other Ambulatory Visit: Payer: Self-pay

## 2024-03-01 ENCOUNTER — Other Ambulatory Visit (HOSPITAL_COMMUNITY): Payer: Self-pay

## 2024-03-02 ENCOUNTER — Other Ambulatory Visit (HOSPITAL_COMMUNITY): Payer: Self-pay

## 2024-03-03 ENCOUNTER — Other Ambulatory Visit (HOSPITAL_COMMUNITY): Payer: Self-pay

## 2024-03-03 MED ORDER — CARVEDILOL 6.25 MG PO TABS
ORAL_TABLET | ORAL | 1 refills | Status: AC
  Filled 2024-03-03 – 2024-05-22 (×2): qty 180, 90d supply, fill #0

## 2024-03-03 MED ORDER — CARVEDILOL 6.25 MG PO TABS
ORAL_TABLET | ORAL | 1 refills | Status: AC
  Filled 2024-03-03: qty 180, 90d supply, fill #0

## 2024-03-06 ENCOUNTER — Other Ambulatory Visit: Payer: Self-pay

## 2024-03-06 ENCOUNTER — Other Ambulatory Visit (HOSPITAL_COMMUNITY): Payer: Self-pay

## 2024-03-06 MED ORDER — CLOPIDOGREL BISULFATE 75 MG PO TABS
75.0000 mg | ORAL_TABLET | Freq: Every day | ORAL | 3 refills | Status: AC
  Filled 2024-03-06: qty 90, 90d supply, fill #0
  Filled 2024-05-22: qty 90, 90d supply, fill #1

## 2024-03-20 ENCOUNTER — Other Ambulatory Visit: Payer: Self-pay

## 2024-04-19 ENCOUNTER — Other Ambulatory Visit (HOSPITAL_COMMUNITY): Payer: Self-pay

## 2024-04-19 ENCOUNTER — Other Ambulatory Visit: Payer: Self-pay

## 2024-04-20 ENCOUNTER — Other Ambulatory Visit (HOSPITAL_COMMUNITY): Payer: Self-pay

## 2024-04-20 MED ORDER — AMLODIPINE BESYLATE 10 MG PO TABS
10.0000 mg | ORAL_TABLET | Freq: Every day | ORAL | 3 refills | Status: AC
  Filled 2024-04-20: qty 90, 90d supply, fill #0

## 2024-04-20 MED ORDER — LISINOPRIL 40 MG PO TABS
40.0000 mg | ORAL_TABLET | Freq: Every day | ORAL | 3 refills | Status: AC
  Filled 2024-04-20: qty 90, 90d supply, fill #0

## 2024-04-26 ENCOUNTER — Other Ambulatory Visit: Payer: Self-pay

## 2024-04-26 ENCOUNTER — Other Ambulatory Visit (HOSPITAL_COMMUNITY): Payer: Self-pay

## 2024-04-26 MED ORDER — CITALOPRAM HYDROBROMIDE 10 MG PO TABS
10.0000 mg | ORAL_TABLET | Freq: Every day | ORAL | 3 refills | Status: AC
  Filled 2024-04-26: qty 90, 90d supply, fill #0

## 2024-04-26 MED ORDER — CLOPIDOGREL BISULFATE 75 MG PO TABS
75.0000 mg | ORAL_TABLET | Freq: Every day | ORAL | 3 refills | Status: AC
  Filled 2024-04-26: qty 90, 90d supply, fill #0

## 2024-05-11 ENCOUNTER — Other Ambulatory Visit (HOSPITAL_COMMUNITY): Payer: Self-pay

## 2024-05-12 ENCOUNTER — Other Ambulatory Visit: Payer: Self-pay

## 2024-05-12 ENCOUNTER — Other Ambulatory Visit (HOSPITAL_COMMUNITY): Payer: Self-pay

## 2024-05-12 MED ORDER — LANTUS 100 UNIT/ML ~~LOC~~ SOLN
50.0000 [IU] | Freq: Every day | SUBCUTANEOUS | 11 refills | Status: AC
  Filled 2024-05-12: qty 50, 100d supply, fill #0

## 2024-05-12 MED ORDER — DONEPEZIL HCL 10 MG PO TABS
10.0000 mg | ORAL_TABLET | Freq: Every day | ORAL | 3 refills | Status: AC
  Filled 2024-05-12: qty 90, 90d supply, fill #0

## 2024-05-22 ENCOUNTER — Other Ambulatory Visit: Payer: Self-pay

## 2024-05-22 ENCOUNTER — Other Ambulatory Visit (HOSPITAL_COMMUNITY): Payer: Self-pay

## 2024-05-22 MED ORDER — CLONIDINE HCL 0.1 MG PO TABS
0.1000 mg | ORAL_TABLET | Freq: Two times a day (BID) | ORAL | 3 refills | Status: AC
  Filled 2024-05-22: qty 180, 90d supply, fill #0

## 2024-05-23 ENCOUNTER — Other Ambulatory Visit: Payer: Self-pay
